# Patient Record
Sex: Female | Born: 1970
Health system: Southern US, Community
[De-identification: ages and names within clinical notes are randomized; demographics above are authoritative.]

## PROBLEM LIST (undated history)

## (undated) DIAGNOSIS — R000 Tachycardia, unspecified: Secondary | ICD-10-CM

## (undated) DIAGNOSIS — D649 Anemia, unspecified: Secondary | ICD-10-CM

---

## 1998-01-13 ENCOUNTER — Other Ambulatory Visit: Admission: RE | Admit: 1998-01-13 | Discharge: 1998-01-13 | Payer: Self-pay | Admitting: Gynecology

## 1999-05-31 ENCOUNTER — Other Ambulatory Visit: Admission: RE | Admit: 1999-05-31 | Discharge: 1999-05-31 | Payer: Self-pay | Admitting: Gynecology

## 2000-06-17 ENCOUNTER — Other Ambulatory Visit: Admission: RE | Admit: 2000-06-17 | Discharge: 2000-06-17 | Payer: Self-pay | Admitting: Gynecology

## 2002-03-09 ENCOUNTER — Other Ambulatory Visit: Admission: RE | Admit: 2002-03-09 | Discharge: 2002-03-09 | Payer: Self-pay | Admitting: Gynecology

## 2003-07-06 ENCOUNTER — Other Ambulatory Visit: Admission: RE | Admit: 2003-07-06 | Discharge: 2003-07-06 | Payer: Self-pay | Admitting: Gynecology

## 2004-03-19 ENCOUNTER — Inpatient Hospital Stay (HOSPITAL_COMMUNITY): Admission: AD | Admit: 2004-03-19 | Discharge: 2004-03-23 | Payer: Self-pay | Admitting: Obstetrics and Gynecology

## 2004-07-27 ENCOUNTER — Other Ambulatory Visit: Admission: RE | Admit: 2004-07-27 | Discharge: 2004-07-27 | Payer: Self-pay | Admitting: Gynecology

## 2005-07-23 ENCOUNTER — Other Ambulatory Visit: Admission: RE | Admit: 2005-07-23 | Discharge: 2005-07-23 | Payer: Self-pay | Admitting: Gynecology

## 2006-01-11 ENCOUNTER — Ambulatory Visit (HOSPITAL_COMMUNITY): Admission: RE | Admit: 2006-01-11 | Discharge: 2006-01-11 | Payer: Self-pay | Admitting: Obstetrics and Gynecology

## 2006-01-11 ENCOUNTER — Encounter (INDEPENDENT_AMBULATORY_CARE_PROVIDER_SITE_OTHER): Payer: Self-pay | Admitting: Specialist

## 2006-05-16 ENCOUNTER — Inpatient Hospital Stay (HOSPITAL_COMMUNITY): Admission: AD | Admit: 2006-05-16 | Discharge: 2006-05-16 | Payer: Self-pay | Admitting: Obstetrics and Gynecology

## 2006-11-29 ENCOUNTER — Inpatient Hospital Stay (HOSPITAL_COMMUNITY): Admission: RE | Admit: 2006-11-29 | Discharge: 2006-12-02 | Payer: Self-pay | Admitting: Obstetrics and Gynecology

## 2006-11-29 ENCOUNTER — Encounter (INDEPENDENT_AMBULATORY_CARE_PROVIDER_SITE_OTHER): Payer: Self-pay | Admitting: Obstetrics and Gynecology

## 2010-10-16 ENCOUNTER — Emergency Department (HOSPITAL_COMMUNITY): Payer: BC Managed Care – PPO

## 2010-10-16 ENCOUNTER — Emergency Department (HOSPITAL_COMMUNITY)
Admission: EM | Admit: 2010-10-16 | Discharge: 2010-10-17 | Disposition: A | Discharge status: Home / Self Care | Payer: BC Managed Care – PPO | Attending: Emergency Medicine | Admitting: Emergency Medicine

## 2010-10-16 DIAGNOSIS — R209 Unspecified disturbances of skin sensation: Secondary | ICD-10-CM | POA: Insufficient documentation

## 2010-10-16 DIAGNOSIS — H538 Other visual disturbances: Secondary | ICD-10-CM | POA: Insufficient documentation

## 2010-10-16 DIAGNOSIS — F410 Panic disorder [episodic paroxysmal anxiety] without agoraphobia: Secondary | ICD-10-CM | POA: Insufficient documentation

## 2010-10-16 DIAGNOSIS — R0989 Other specified symptoms and signs involving the circulatory and respiratory systems: Secondary | ICD-10-CM | POA: Insufficient documentation

## 2010-10-16 DIAGNOSIS — R0609 Other forms of dyspnea: Secondary | ICD-10-CM | POA: Insufficient documentation

## 2010-10-16 LAB — POCT I-STAT, CHEM 8
BUN: <3 mg/dL — ABNORMAL LOW (ref 6–23)
Calcium, Ion: 1.05 mmol/L — ABNORMAL LOW (ref 1.12–1.32)
Chloride: 108 mEq/L (ref 96–112)
Creatinine, Ser: 0.7 mg/dL (ref 0.4–1.2)
Glucose, Bld: 123 mg/dL — ABNORMAL HIGH (ref 70–99)
TCO2: 17 mmol/L (ref 0–100)

## 2010-10-16 LAB — CBC
HCT: 35.3 % — ABNORMAL LOW (ref 36.0–46.0)
MCH: 30.3 pg (ref 26.0–34.0)
MCHC: 34.6 g/dL (ref 30.0–36.0)
MCV: 87.8 fL (ref 78.0–100.0)
Platelets: 301 10*3/uL (ref 150–400)
RDW: 12.9 % (ref 11.5–15.5)
WBC: 11.5 10*3/uL — ABNORMAL HIGH (ref 4.0–10.5)

## 2010-10-16 LAB — POCT CARDIAC MARKERS: CKMB, poc: <1 ng/mL — ABNORMAL LOW (ref 1.0–8.0)

## 2010-10-16 LAB — GLUCOSE, CAPILLARY

## 2010-10-16 LAB — PRO B NATRIURETIC PEPTIDE: Pro B Natriuretic peptide (BNP): 41.1 pg/mL (ref 0–125)

## 2010-10-17 NOTE — Op Note (Signed)
Tammy Roth, Tammy Roth NO.:  [id number]   MEDICAL RECORD NO.:  [id number]          PATIENT TYPE:  INP   LOCATION:  9199                          FACILITY:  [facility]   PHYSICIAN:  Carrington Clamp, M.D. DATE OF BIRTH:  [birth year]   DATE OF PROCEDURE:  11/29/2006  DATE OF DISCHARGE:                               OPERATIVE REPORT   PREOPERATIVE DIAGNOSIS:  Repeat cesarean section at term and multiparous  - desires permanent sterility.   POSTOPERATIVE DIAGNOSIS:  Repeat cesarean section at term and  multiparous - desires permanent sterility.   PROCEDURE:  Repeat low transverse Cesarean section at term and bilateral  tubal ligation.   SURGEON:  Carrington Clamp, M.D.   ASSISTANT:  Ilda Mori, M.D.   ANESTHESIA:  Spinal.   SPECIMENS:  Right and left tubal segments.   ESTIMATED BLOOD LOSS:  400 mL.   INTRAVENOUS FLUIDS:  2500 mL.   URINE OUTPUT:  200 mL.   COMPLICATIONS:  None.   FINDINGS:  An 8-pound 4-ounce baby girl, Apgars 8 and 9. There were  normal tubes, ovaries and uterus otherwise seen.   MEDICATIONS:  Pitocin and Ancef.   COUNTS:  Were correct x3.   TECHNIQUE:  After spinal anesthesia was achieved, the patient was  prepped and draped in the usual sterile fashion in the dorsal supine  position with the leftward tilt. Pfannenstiel skin incision was made  with a scalpel and carried down to the fascia with Bovie cautery. Fascia  was incised in the midline with the scalpel and then carried in a  transverse curvilinear manner with the Mayo scissors. Fascia was  reflected superiorly and inferiorly from the rectus muscles and the  rectus muscles split in the midline with both sharp and blunt  dissection. A bowel-free portion of peritoneum was entered into bluntly  and the peritoneum stretched up after the rectus muscles were split with  the Mayo scissors. The bladder blade was placed, and the bladder flap  created with incision at the vesicouterine  fascia in the upper portion  of the lower uterine segment in a transverse curvilinear manner. Bladder  flap was created with sharp and blunt dissection and bladder place  replaced.   A 2-cm incision was made in the upper portion of the lower uterine  segment in transverse curvilinear manner until the amnion could be seen.  The incision was stretched open with blunt dissection, and the amnion  ruptured with Allis. Clear fluid was noted. The baby was identified in  the vertex presentation, delivered without complication. The baby was  bulb suctioned, and the cord was clamped and cut. Cord blood was  obtained.  Baby was handed to waiting pediatrics.   The placenta was then delivered manually and the uterus exteriorized,  wrapped in wet lap, cleared of all debris. The uterine incision was  closed with a running locked stitch of 0 Monocryl. An imbricating layer  of 0 Monocryl was performed as well. Attention was turned to the  fallopian tubes, which each were tented up with a Babcock. A vascular  portion of the mesosalpinx was then gently treated with  the Bovie  cautery and 2 free-hand ties of plain gut were passed and tied on each  side to create an intervening segment of approximately 3 cm. An  additional tie was performed on each side, and the segment was excised  with the Metzenbaum scissors. The same procedure was performed on the  left hand side.   The uterus was then approximated in the abdomen, and the uterine  incision and tubal sites were all inspected and found to be hemostatic.  Irrigation was performed, and the abdominal cavity cleared of debris.  The peritoneum was then closed with running stitch of 2-0 Vicryl. This  incorporated the rectus muscles as a separate layer. The fascia was  closed with a running stitch of 0 Vicryl. The subcutaneous tissue was  rendered hemostatic with Bovie cautery irrigation. This was then closed  with interrupted stitches of 2-0 plain gut. The  skin was closed with  staples. The patient was returned to the recovery room in stable  condition.      Carrington Clamp, M.D.  Electronically Signed     MH/MEDQ  D:  11/29/2006  T:  11/29/2006  Job:  [id number]

## 2010-10-20 NOTE — Op Note (Signed)
NAMEWILLETTE, Tammy Roth NO.:  [id number]   MEDICAL RECORD NO.:  [id number]          PATIENT TYPE:  INP   LOCATION:  9138                          [facility]ACILITY:  [facility]   PHYSICIAN:  Carrington Clamp, M.D. DATE OF BIRTH:  20-Jun-1970   DATE OF PROCEDURE:  03/20/2004  DATE OF DISCHARGE:                                 OPERATIVE REPORT   PREOPERATIVE DIAGNOSES:  1.  Term pregnancy.  2.  Arrest of descent.  3.  Failed forceps trial x1.   POSTOPERATIVE DIAGNOSES:  1.  Term pregnancy.  2.  Arrest of descent.  3.  Failed forceps trial x1.   PROCEDURE:  Primary low transverse cesarean section.   ATTENDING PHYSICIAN:  Carrington Clamp, M.D.   ANESTHESIA:  Epidural.   ESTIMATED BLOOD LOSS:  1000 mL.   IV FLUIDS:  2500 mL.   URINE OUTPUT:  250 mL.   COMPLICATIONS:  Needle stick injury of the scrub tech. The patient was  notified. There was no cross contamination of the patient. Needle stick  injury page book is being filled out.   FINDINGS:  Female infant in the vertex presentation direct OP.  Apgar's were 8  & 9, weight 9 pounds. There were normal tubes, uterus and ovaries seen. The  vagina was inspected both manually and visually at the end of the procedure  to ensure that there were no lacerations from the forceps attempt and there  were none.   MEDICATIONS:  Ancef and Pitocin.   PATHOLOGY:  None.   COUNTS:  Correct x3.   INDICATIONS FOR PROCEDURE:  The patient became complete, complete and zero  and pushed 2 hours and 20 minutes to plus 1.  The epidural was not giving  complete relief and so the patient was redosed and placed into Sims to try  and turn the baby from OP.  The patient rested for about 30 minutes and was  rechecked and found to be OP and plus 2.  A pudendal block was then  administered with about 10 mL of plain lidocaine with relatively good relief  especially of the pelvis. Luikart forceps were successfully placed without  complications and  minimal discomfort to the patient. However, during one  contraction, adequate traction was placed on the forceps but the baby did  not descend. Forceps were then removed and the patient was counseled for a  primary cesarean section for arrest of descent. At that point, manual  investigation of the vagina revealed no lacerations.  The patient had her  epidural replaced before the surgery.   TECHNIQUE:  After the second epidural had been placed and adequate relief  achieved, the patient was prepped and draped in the usual sterile fashion in  the dorsal supine position with a leftward tilt. The Foley catheter was  emptying the bladder. A Pfannenstiel skin incision was made with the scalpel  and carried down to the fascia with the Bovie cautery. The fascia was  incised with the midline with the scalpel and carried in a transverse  curvilinear manner with the Mayo scissors. The fascia was then reflected  superiorly and inferiorly  from the rectus muscles and the bowel free portion  of the peritoneum was entered into bluntly. Good visualization of the bowel  and bladder was noted as the peritoneum was then incised in a superior and  inferior manner.   The bladder blade was replaced and the vesicouterine fascia was tented up.  This was then incised in a transverse curvilinear manner and the bladder  flap created bluntly. The bladder blade was replaced and a 2 cm incision was  made in the upper portion of the lower uterine segment. Clear fluid was  noted upon entry to the amnion and the bandage scissors were used to extend  the incision in the transverse curvilinear manner. The baby was identified  in the direct OP presentation and delivered without complication. The baby  was bulb suctioned and cord was clamped and cut. The baby was handed to  waiting pediatrics and cord bloods were obtained.   The placenta was then cleared manually and the uterus exteriorized, wrapped  in a wet lap, and  cleared of all debris.  Bladder blade was replaced and  ring forceps were used to identify the angles of the incision. There was an  extension into the cervix and the angle of this was able to be brought up  with a ring forceps. Stitch of #0 Vicryl secured the extension in a running  locked fashion. A running locked stitch was then used to close the rest of  the uterine incision with #0 Monocryl. An imbricating layer was used to  close this as well and several figure-of-eight stitches were needed to help  ensure hemostasis after the imbricating layer.   The uterus was then reapproximated in the abdomen and the gutters cleared of  all debris with irrigation. The uterine incisions reinspected and an  additional figure-of-eight suture was placed at this time to ensure  hemostasis. Hemostasis was achieved and all instruments were then withdrawn  from the abdomen. It was during the uterine closure that the needle stick  injury of the scrub tech had happened.   The peritoneum was then closed with a running stitch of 2-0 Vicryl. The  __________ also came back and included a second layer of the muscles. The  fascia was then closed with a running stitch of #0 Vicryl. The subcutaneous  tissue was rendered hemostatic with the Bovie cautery and irrigation and  then was closed with interrupted stitches of 2-0 plain gut. The skin was  closed with staples.   At this point, attention was turned to the vagina where a ring forceps,  sponge stick and a right angled retractor were used to visually inspect the  vaginal vault. Manual inspection was also performed and there were no  lacerations noted. The patient tolerated the procedure well and was returned  to the recovery room in stable condition.      MH/MEDQ  D:  03/20/2004  T:  03/21/2004  Job:  [id number]

## 2010-10-20 NOTE — Discharge Summary (Signed)
NAMENOHEMY, KOOP            ACCOUNT NO.:  [id number]   MEDICAL RECORD NO.:  [id number]          PATIENT TYPE:  INP   LOCATION:  9114                          [facility]ACILITY:  [facility]   PHYSICIAN:  Kendra H. Tammy Craw, MD     DATE OF BIRTH:  1970-12-12   DATE OF ADMISSION:  11/29/2006  DATE OF DISCHARGE:  12/02/2006                               DISCHARGE SUMMARY   FINAL DIAGNOSIS:  Intrauterine pregnancy at term.  History of prior  cesarean section.  The patient desires repeat cesarean section,  multiparous, desires permanent sterilization.   PROCEDURE:  Repeat low transverse cesarean section and bilateral tubal  ligation.  Surgeon Dr. Carrington Clamp with Dr. Ilda Mori.   COMPLICATIONS:  None.   HISTORY OF PRESENT ILLNESS:  This 40 year old G3, P1-0-1-1 presents at  term for repeat cesarean section.  The patient had had a prior cesarean  section with her first pregnancy and desires repeat with this pregnancy  as well.  Her antepartum course has been uncomplicated.  She expressed  her desires for postpartum tubal ligation throughout her hospital  course.  The patient was taken to the operating room on November 29, 2006,  by Dr. Carrington Clamp where a repeat low transverse cesarean section  was performed with the delivery of an 8 pounds, 4 ounces female infant  with Apgars of 8 and 9.  Delivery went without complications.  The  patient still expressed her desires for permanent sterilization.  This  was performed without complications.  The patient's postoperative course  was benign without any significant fevers.  She was felt ready for  discharge on postoperative day #3.  She was sent home on a regular diet,  told to decrease activities, told to continue her prenatal vitamins, was  given Percocet 1-2 every 4-6 hours as needed for the pain.  Was to  follow up in our office in 2 weeks for an incision check.  Instructions  and precautions were reviewed with the patient.   LABORATORIES  ON DISCHARGE:  The patient had a hemoglobin of 10.9, white  blood cell count of 7.4 and platelets of 245,000.      Tammy Roth, P.A.-C.      Tammy Roth. Tammy Craw, MD  Electronically Signed    MB/MEDQ  D:  01/07/2007  T:  01/07/2007  Job:  [id number]

## 2010-10-20 NOTE — Discharge Summary (Signed)
Tammy Roth, Tammy Roth NO.:  [id number]   MEDICAL RECORD NO.:  [id number]          PATIENT TYPE:  INP   LOCATION:  9138                          [facility]   PHYSICIAN:  Carrington Clamp, M.D. DATE OF BIRTH:  1971-02-03   DATE OF ADMISSION:  03/19/2004  DATE OF DISCHARGE:  03/23/2004                                 DISCHARGE SUMMARY   FINAL DIAGNOSES:  Intrauterine pregnancy at term, arrest of descent, and  failed forceps trial x1.   PROCEDURE:  Primary low transverse cesarean section.   SURGEON:  Carrington Clamp, M.D.   COMPLICATIONS:  None.   HISTORY OF PRESENT ILLNESS:  This 40 year old G1, P0 presents to [facility] on March 19, 2004 with mild regular contractions.  The patient's blood pressure was noted to be slightly elevated at about  144/99 and patient was admitted for management of pregnancy induced  hypertension at this time. The patient's cervix upon admission was 1 cm  dilated, 40% effaced, at a minus 2 station. She did have about 2+ lower  extremity edema. The patient was admitted. Blood work was run and induction  of labor was begun since the patient was at term. The patient's pregnancy  induced hypertension labs were all within normal limits. The patient dilated  to complete. She pushed for about 2 hours. After pushing for a while, her  epidural was not giving her complete pain relief. She was re-dosed and when  she was rechecked the baby was found to be OP at plus 2 station. At this  point, Luikart forceps were placed without complication, pulled with one  contraction but without the baby coming down at all. Forceps were removed at  this point and the patient was counseled about the arrest of descent. The  patient was taken to the operating room on the 17th by Dr. Carrington Clamp,  where a primary low transverse cesarean section was performed with the  delivery of a 9 pound 0 ounce female infant with Apgar's of 8 and  9. Delivery  went without complications. The patient's postoperative course was benign  without significant fevers. She just had some postoperative anemia. She was  felt ready for discharge on postoperative day 3. Was sent home on a regular  diet. Told to decrease activities. Told to continue her prenatal vitamins  and her iron supplement three times daily. She was given Percocet 1 to 2  every 4 hours as needed for pain. Told that she could use over-the-counter  Motrin if needed. She received her Rubella vaccine before discharge because  she had an equivocal titer that was drawn at the beginning of her pregnancy.  She was to followup in the office in 4 weeks.   LABORATORY DATA:  On discharge, the patient had a hemoglobin of 8.5, white  blood cell count of 10.8 and PIH labs all within normal limits.      MB/MEDQ  D:  04/17/2004  T:  04/17/2004  Job:  [id number]   cc:   Carrington Clamp, M.D.  [address]  [address]  [place], [place] [address]  Fax: [phone]

## 2010-10-20 NOTE — Op Note (Signed)
Tammy Roth, Tammy NO.:  [id number]   MEDICAL RECORD NO.:  [id number]          PATIENT TYPE:  AMB   LOCATION:  SDC                           [facility]ILITY:  [facility]   PHYSICIAN:  Carrington Clamp, M.D. DATE OF BIRTH:  10-17-1970   DATE OF PROCEDURE:  01/11/2006  DATE OF DISCHARGE:  01/11/2006                                 OPERATIVE REPORT   PREOPERATIVE DIAGNOSIS:  Missed abortion.   POSTOPERATIVE DIAGNOSIS:  Missed abortion.   PROCEDURE:  Dilation and evacuation.   SURGEON:  Carrington Clamp, M.D.   ASSISTANT:  None.   ANESTHESIA:  General.   SPECIMEN:  Uterine contents.   ESTIMATED BLOOD LOSS:  Minimal.   INTRAVENOUS FLUIDS:  800 mL.   URINE OUTPUT:  Not measured.   COMPLICATIONS:  None.   FINDINGS:  A 7 weeks size uterus down to 6 weeks size with good crie post  procedure.   MEDICATIONS:  Methergine.   COUNTS:  Correct x3.   TECHNIQUE:  After adequate general anesthesia was achieved, the patient was  prepped and draped in usual sterile fashion in dorsal lithotomy position.  The bladder was emptied with a red rubber catheter and then speculum placed  in the vagina.  The cervix was dilated up with Shawnie Pons dilators and a 9-mm  curette passed into the uterus.  Alternating  suction and sharp curettage was performed until good crie was obtained.  All  instruments were then withdrawn from the cervix and the vagina.  The patient  tolerated the procedure well and was returned to the recovery room in stable  condition.      Carrington Clamp, M.D.  Electronically Signed     MH/MEDQ  D:  03/07/2006  T:  03/08/2006  Job:  [id number]

## 2011-03-21 LAB — CBC
HCT: 30.1 — ABNORMAL LOW
HCT: 35.4 — ABNORMAL LOW
Hemoglobin: 10.3 — ABNORMAL LOW
Hemoglobin: 10.9 — ABNORMAL LOW
MCHC: 34.1
MCHC: 34.3
MCV: 92.5
MCV: 93.5
Platelets: 255
RBC: 3.22 — ABNORMAL LOW
RBC: 3.42 — ABNORMAL LOW
RBC: 3.83 — ABNORMAL LOW
RDW: 13.7
WBC: 8.5

## 2011-03-21 LAB — RPR: RPR Ser Ql: NONREACTIVE

## 2011-05-23 ENCOUNTER — Ambulatory Visit: Payer: Self-pay

## 2012-09-10 ENCOUNTER — Other Ambulatory Visit: Payer: Self-pay | Admitting: Obstetrics and Gynecology

## 2013-09-15 ENCOUNTER — Other Ambulatory Visit: Payer: Self-pay | Admitting: Obstetrics and Gynecology

## 2013-09-17 ENCOUNTER — Other Ambulatory Visit: Payer: Self-pay | Admitting: Obstetrics and Gynecology

## 2013-09-17 DIAGNOSIS — R928 Other abnormal and inconclusive findings on diagnostic imaging of breast: Secondary | ICD-10-CM

## 2013-09-25 ENCOUNTER — Encounter (INDEPENDENT_AMBULATORY_CARE_PROVIDER_SITE_OTHER): Payer: Self-pay

## 2013-09-25 ENCOUNTER — Ambulatory Visit
Admission: RE | Admit: 2013-09-25 | Discharge: 2013-09-25 | Disposition: A | Discharge status: Home / Self Care | Payer: BC Managed Care – PPO | Source: Ambulatory Visit | Attending: Obstetrics and Gynecology | Admitting: Obstetrics and Gynecology

## 2013-09-25 DIAGNOSIS — R928 Other abnormal and inconclusive findings on diagnostic imaging of breast: Secondary | ICD-10-CM

## 2014-01-27 ENCOUNTER — Emergency Department (HOSPITAL_COMMUNITY): Payer: BC Managed Care – PPO

## 2014-01-27 ENCOUNTER — Encounter (HOSPITAL_COMMUNITY): Payer: Self-pay | Admitting: Emergency Medicine

## 2014-01-27 ENCOUNTER — Emergency Department (HOSPITAL_COMMUNITY)
Admission: EM | Admit: 2014-01-27 | Discharge: 2014-01-27 | Disposition: A | Discharge status: Home / Self Care | Payer: BC Managed Care – PPO | Attending: Emergency Medicine | Admitting: Emergency Medicine

## 2014-01-27 DIAGNOSIS — R51 Headache: Secondary | ICD-10-CM | POA: Insufficient documentation

## 2014-01-27 DIAGNOSIS — R000 Tachycardia, unspecified: Secondary | ICD-10-CM | POA: Diagnosis not present

## 2014-01-27 DIAGNOSIS — R002 Palpitations: Secondary | ICD-10-CM | POA: Diagnosis present

## 2014-01-27 DIAGNOSIS — Z862 Personal history of diseases of the blood and blood-forming organs and certain disorders involving the immune mechanism: Secondary | ICD-10-CM | POA: Diagnosis not present

## 2014-01-27 DIAGNOSIS — R110 Nausea: Secondary | ICD-10-CM | POA: Diagnosis not present

## 2014-01-27 DIAGNOSIS — Z3202 Encounter for pregnancy test, result negative: Secondary | ICD-10-CM | POA: Diagnosis not present

## 2014-01-27 DIAGNOSIS — R197 Diarrhea, unspecified: Secondary | ICD-10-CM | POA: Insufficient documentation

## 2014-01-27 DIAGNOSIS — R0602 Shortness of breath: Secondary | ICD-10-CM | POA: Diagnosis not present

## 2014-01-27 HISTORY — DX: Tachycardia, unspecified: R00.0

## 2014-01-27 HISTORY — DX: Anemia, unspecified: D64.9

## 2014-01-27 LAB — I-STAT TROPONIN, ED: Troponin i, poc: 0 ng/mL (ref 0.00–0.08)

## 2014-01-27 LAB — BASIC METABOLIC PANEL
Anion gap: 16 — ABNORMAL HIGH (ref 5–15)
BUN: 6 mg/dL (ref 6–23)
CHLORIDE: 104 meq/L (ref 96–112)
CO2: 20 meq/L (ref 19–32)
Calcium: 9.2 mg/dL (ref 8.4–10.5)
Creatinine, Ser: 0.7 mg/dL (ref 0.50–1.10)
GFR calc Af Amer: >90 mL/min (ref >90)
GFR calc non Af Amer: >90 mL/min (ref >90)
GLUCOSE: 119 mg/dL — AB (ref 70–99)
POTASSIUM: 4.4 meq/L (ref 3.7–5.3)
Sodium: 140 mEq/L (ref 137–147)

## 2014-01-27 LAB — D-DIMER, QUANTITATIVE: D-Dimer, Quant: <0.27 ug/mL-FEU (ref 0.00–0.48)

## 2014-01-27 LAB — URINALYSIS, ROUTINE W REFLEX MICROSCOPIC
BILIRUBIN URINE: NEGATIVE
Glucose, UA: NEGATIVE mg/dL
Hgb urine dipstick: NEGATIVE
KETONES UR: 15 mg/dL — AB
Leukocytes, UA: NEGATIVE
NITRITE: NEGATIVE
PH: 6 (ref 5.0–8.0)
Protein, ur: NEGATIVE mg/dL
SPECIFIC GRAVITY, URINE: 1.019 (ref 1.005–1.030)
UROBILINOGEN UA: 0.2 mg/dL (ref 0.0–1.0)

## 2014-01-27 LAB — TSH: TSH: 1.9 u[IU]/mL (ref 0.350–4.500)

## 2014-01-27 LAB — CBC
HEMATOCRIT: 39.2 % (ref 36.0–46.0)
HEMOGLOBIN: 13.2 g/dL (ref 12.0–15.0)
MCH: 30.4 pg (ref 26.0–34.0)
MCHC: 33.7 g/dL (ref 30.0–36.0)
MCV: 90.3 fL (ref 78.0–100.0)
Platelets: 322 10*3/uL (ref 150–400)
RBC: 4.34 MIL/uL (ref 3.87–5.11)
RDW: 13.5 % (ref 11.5–15.5)
WBC: 6 10*3/uL (ref 4.0–10.5)

## 2014-01-27 LAB — T4, FREE: FREE T4: 1.05 ng/dL (ref 0.80–1.80)

## 2014-01-27 LAB — POC URINE PREG, ED: Preg Test, Ur: NEGATIVE

## 2014-01-27 MED ORDER — SODIUM CHLORIDE 0.9 % IV BOLUS (SEPSIS)
1000.0000 mL | Freq: Once | INTRAVENOUS | Status: AC
  Administered 2014-01-27: 1000 mL via INTRAVENOUS

## 2014-01-27 MED ORDER — ONDANSETRON HCL 4 MG/2ML IJ SOLN
4.0000 mg | Freq: Once | INTRAMUSCULAR | Status: AC
  Administered 2014-01-27: 4 mg via INTRAVENOUS
  Filled 2014-01-27: qty 2

## 2014-01-27 NOTE — ED Notes (Signed)
Per pt  sts heart palpitations over the past few days. sts also some nausea and dizziness.

## 2014-01-27 NOTE — Discharge Instructions (Signed)
°Cardiac Event Monitoring °A cardiac event monitor is a small recording device used to help detect abnormal heart rhythms (arrhythmias). The monitor is used to record heart rhythm when noticeable symptoms such as the following occur: °· Fast heartbeats (palpitations), such as heart racing or fluttering. °· Dizziness. °· Fainting or light-headedness. °· Unexplained weakness. °The monitor is wired to two electrodes placed on your chest. Electrodes are flat, sticky disks that attach to your skin. The monitor can be worn for up to 30 days. You will wear the monitor at all times, except when bathing.  °HOW TO USE YOUR CARDIAC EVENT MONITOR °A technician will prepare your chest for the electrode placement. The technician will show you how to place the electrodes, how to work the monitor, and how to replace the batteries. Take time to practice using the monitor before you leave the office. Make sure you understand how to send the information from the monitor to your health care provider. This requires a telephone with a landline, not a cell phone. You need to: °· Wear your monitor at all times, except when you are in water: °¨ Do not get the monitor wet. °¨ Take the monitor off when bathing. Do not swim or use a hot tub with it on. °· Keep your skin clean. Do not put body lotion or moisturizer on your chest. °· Change the electrodes daily or any time they stop sticking to your skin. You might need to use tape to keep them on. °· It is possible that your skin under the electrodes could become irritated. To keep this from happening, try to put the electrodes in slightly different places on your chest. However, they must remain in the area under your left breast and in the upper right section of your chest. °· Make sure the monitor is safely clipped to your clothing or in a location close to your body that your health care provider recommends. °· Press the button to record when you feel symptoms of heart trouble, such as  dizziness, weakness, light-headedness, palpitations, thumping, shortness of breath, unexplained weakness, or a fluttering or racing heart. The monitor is always on and records what happened slightly before you pressed the button, so do not worry about being too late to get good information. °· Keep a diary of your activities, such as walking, doing chores, and taking medicine. It is especially important to note what you were doing when you pushed the button to record your symptoms. This will help your health care provider determine what might be contributing to your symptoms. The information stored in your monitor will be reviewed by your health care provider alongside your diary entries. °· Send the recorded information as recommended by your health care provider. It is important to understand that it will take some time for your health care provider to process the results. °· Change the batteries as recommended by your health care provider. °SEEK IMMEDIATE MEDICAL CARE IF:  °· You have chest pain. °· You have extreme difficulty breathing or shortness of breath. °· You develop a very fast heartbeat that persists. °· You develop dizziness that does not go away. °· You faint or constantly feel you are about to faint. °Document Released: 02/28/2008 Document Revised: 10/05/2013 Document Reviewed: 11/17/2012 °ExitCare® Patient Information ©2015 [employer]. This information is not intended to replace advice given to you by your health care provider. Make sure you discuss any questions you have with your health care provider. °Palpitations °A palpitation is the feeling   that your heartbeat is irregular or is faster than normal. It may feel like your heart is fluttering or skipping a beat. Palpitations are usually not a serious problem. However, in some cases, you may need further medical evaluation. °CAUSES  °Palpitations can be caused by: °· Smoking. °· Caffeine or other stimulants, such as diet pills or energy  drinks. °· Alcohol. °· Stress and anxiety. °· Strenuous physical activity. °· Fatigue. °· Certain medicines. °· Heart disease, especially if you have a history of irregular heart rhythms (arrhythmias), such as atrial fibrillation, atrial flutter, or supraventricular tachycardia. °· An improperly working pacemaker or defibrillator. °DIAGNOSIS  °To find the cause of your palpitations, your health care provider will take your medical history and perform a physical exam. Your health care provider may also have you take a test called an ambulatory electrocardiogram (ECG). An ECG records your heartbeat patterns over a 24-hour period. You may also have other tests, such as: °· Transthoracic echocardiogram (TTE). During echocardiography, sound waves are used to evaluate how blood flows through your heart. °· Transesophageal echocardiogram (TEE). °· Cardiac monitoring. This allows your health care provider to monitor your heart rate and rhythm in real time. °· Holter monitor. This is a portable device that records your heartbeat and can help diagnose heart arrhythmias. It allows your health care provider to track your heart activity for several days, if needed. °· Stress tests by exercise or by giving medicine that makes the heart beat faster. °TREATMENT  °Treatment of palpitations depends on the cause of your symptoms and can vary greatly. Most cases of palpitations do not require any treatment other than time, relaxation, and monitoring your symptoms. Other causes, such as atrial fibrillation, atrial flutter, or supraventricular tachycardia, usually require further treatment. °HOME CARE INSTRUCTIONS  °· Avoid: °· Caffeinated coffee, tea, soft drinks, diet pills, and energy drinks. °· Chocolate. °· Alcohol. °· Stop smoking if you smoke. °· Reduce your stress and anxiety. Things that can help you relax include: °· A method of controlling things in your body, such as your heartbeats, with your mind  (biofeedback). °· Yoga. °· Meditation. °· Physical activity such as swimming, jogging, or walking. °· Get plenty of rest and sleep. °SEEK MEDICAL CARE IF:  °· You continue to have a fast or irregular heartbeat beyond 24 hours. °· Your palpitations occur more often. °SEEK IMMEDIATE MEDICAL CARE IF: °· You have chest pain or shortness of breath. °· You have a severe headache. °· You feel dizzy or you faint. °MAKE SURE YOU: °· Understand these instructions. °· Will watch your condition. °· Will get help right away if you are not doing well or get worse. °Document Released: 05/18/2000 Document Revised: 05/26/2013 Document Reviewed: 07/20/2011 °ExitCare® Patient Information ©2015 [employer]. This information is not intended to replace advice given to you by your health care provider. Make sure you discuss any questions you have with your health care provider. ° °

## 2014-01-27 NOTE — ED Notes (Signed)
Hx of heart racing x 3 years ago and she was placed  On a med for it

## 2014-01-27 NOTE — ED Provider Notes (Signed)
CSN: [id number]     Arrival date & time 01/27/14  4782 History   First MD Initiated Contact with Patient 01/27/14 [phone]     Chief Complaint  Patient presents with  . Palpitations     (Consider location/radiation/quality/duration/timing/severity/associated sxs/prior Treatment) HPI 43 year old female presents with palpitations over the last 3 days. She states they come and go multiple times day. They last about 20 seconds. She feels short of breath after they resolved. She states they're not causing her pain but she feels like her heart is beating out of her chest. She thinks she's been a little more short of breath than normal with exertion over the past week. She's been persistently nauseous over the past week. She's had a couple loose stools per day over last 4-5 days. Denies any fevers. Has had a generalized headache is similar to prior headaches during this time. No urinary symptoms. No neck swelling or neck pain. She has a history of tachycardia that she describes as different than this and used to be on medicine from her PCP, Dr. Hyacinth Meeker. She called her PCP this morning and advised to go to the ED.  Past Medical History  Diagnosis Date  . Tachycardia   . Anemia    History reviewed. No pertinent past surgical history. History reviewed. No pertinent family history. History  Substance Use Topics  . Smoking status: Never Smoker   . Smokeless tobacco: Not on file  . Alcohol Use: No   OB History   Grav Para Term Preterm Abortions TAB SAB Ect Mult Living                 Review of Systems  Constitutional: Negative for fever.  Respiratory: Positive for shortness of breath. Negative for cough.   Cardiovascular: Positive for palpitations. Negative for chest pain and leg swelling.  Gastrointestinal: Positive for nausea and diarrhea. Negative for vomiting and abdominal pain.  Genitourinary: Negative for dysuria.  Musculoskeletal: Negative for back pain.  Neurological: Positive for  headaches.  All other systems reviewed and are negative.     Allergies  Review of patient's allergies indicates no known allergies.  Home Medications   Prior to Admission medications   Not on File   BP 138/87  Pulse 107  Temp(Src) 98.1 F (36.7 C) (Oral)  Resp 8  SpO2 100%  LMP 01/18/2014 Physical Exam  Nursing note and vitals reviewed. Constitutional: She is oriented to person, place, and time. She appears well-developed and well-nourished. No distress.  HENT:  Head: Normocephalic and atraumatic.  Right Ear: External ear normal.  Left Ear: External ear normal.  Nose: Nose normal.  Eyes: Right eye exhibits no discharge. Left eye exhibits no discharge.  Neck: No thyromegaly present.  Cardiovascular: Regular rhythm and normal heart sounds.  Tachycardia present.   Pulmonary/Chest: Effort normal and breath sounds normal. She has no wheezes. She has no rales.  Abdominal: Soft. She exhibits no distension. There is no tenderness.  Neurological: She is alert and oriented to person, place, and time.  Skin: Skin is warm and dry.    ED Course  Procedures (including critical care time) Labs Review Labs Reviewed  BASIC METABOLIC PANEL - Abnormal; Notable for the following:    Glucose, Bld 119 (*)    Anion gap 16 (*)    All other components within normal limits  URINALYSIS, ROUTINE W REFLEX MICROSCOPIC - Abnormal; Notable for the following:    Color, Urine AMBER (*)    APPearance CLOUDY (*)  Ketones, ur 15 (*)    All other components within normal limits  CBC  D-DIMER, QUANTITATIVE  TSH  T4, FREE  I-STAT TROPOININ, ED  POC URINE PREG, ED    Imaging Review Dg Chest 2 View  01/27/2014   CLINICAL DATA:  43 year old female with palpitations. Initial encounter.  EXAM: CHEST  2 VIEW  COMPARISON:  10/16/2010.  FINDINGS: Improved lung volumes. Normal cardiac size and mediastinal contours. Visualized tracheal air column is within normal limits. The lungs are clear aside from  mildly increased interstitial markings diffusely. No acute osseous abnormality identified.  IMPRESSION: No acute cardiopulmonary abnormality.   Electronically Signed   By: Augusto Gamble M.D.   On: 01/27/2014 10:16     EKG Interpretation   Date/Time:  Wednesday January 27 2014 09:21:46 EDT Ventricular Rate:  129 PR Interval:  150 QRS Duration: 76 QT Interval:  310 QTC Calculation: 454 R Axis:   69 Text Interpretation:  Sinus tachycardia Nonspecific ST abnormality  Abnormal ECG nonspecific changes similar to 2012 Confirmed by Anneta Rounds   MD, Moraima Burd ([id number]) on 01/27/2014 9:32:33 AM      MDM   Final diagnoses:  Palpitations    Patient with palpitations of uncertain etiology. Will arrhythmia she has here is sinus tachycardia. This resolved with IV fluids and Zofran. Given her small ketones in her urine is likely a dehydration component to her tachycardia. She states she has not had any of her palpitations while in the ED. I have low suspicion this is ACS related. Given her intermittent shortness of breath a d-dimer was evaluated to rule out low-risk PE and is negative. At this time she is stable for discharge, and I'll recommend a Holter monitor with her PCP.    Audree Camel, MD 01/27/14 1229

## 2015-04-12 ENCOUNTER — Other Ambulatory Visit: Payer: Self-pay | Admitting: Obstetrics and Gynecology

## 2015-10-25 DIAGNOSIS — I10 Essential (primary) hypertension: Secondary | ICD-10-CM | POA: Diagnosis not present

## 2015-10-25 DIAGNOSIS — R946 Abnormal results of thyroid function studies: Secondary | ICD-10-CM | POA: Diagnosis not present

## 2015-10-25 DIAGNOSIS — R5383 Other fatigue: Secondary | ICD-10-CM | POA: Diagnosis not present

## 2015-10-25 DIAGNOSIS — R000 Tachycardia, unspecified: Secondary | ICD-10-CM | POA: Diagnosis not present

## 2015-10-27 DIAGNOSIS — E669 Obesity, unspecified: Secondary | ICD-10-CM | POA: Diagnosis not present

## 2015-10-27 DIAGNOSIS — R000 Tachycardia, unspecified: Secondary | ICD-10-CM | POA: Diagnosis not present

## 2015-11-23 DIAGNOSIS — Z1231 Encounter for screening mammogram for malignant neoplasm of breast: Secondary | ICD-10-CM | POA: Diagnosis not present

## 2015-11-23 DIAGNOSIS — Z01419 Encounter for gynecological examination (general) (routine) without abnormal findings: Secondary | ICD-10-CM | POA: Diagnosis not present

## 2015-11-23 DIAGNOSIS — Z6834 Body mass index (BMI) 34.0-34.9, adult: Secondary | ICD-10-CM | POA: Diagnosis not present

## 2015-11-30 ENCOUNTER — Other Ambulatory Visit: Payer: Self-pay | Admitting: Obstetrics and Gynecology

## 2015-11-30 DIAGNOSIS — R928 Other abnormal and inconclusive findings on diagnostic imaging of breast: Secondary | ICD-10-CM

## 2015-12-05 ENCOUNTER — Ambulatory Visit
Admission: RE | Admit: 2015-12-05 | Discharge: 2015-12-05 | Disposition: A | Discharge status: Home / Self Care | Payer: BLUE CROSS/BLUE SHIELD | Source: Ambulatory Visit | Attending: Obstetrics and Gynecology | Admitting: Obstetrics and Gynecology

## 2015-12-05 ENCOUNTER — Other Ambulatory Visit: Payer: Self-pay | Admitting: Obstetrics and Gynecology

## 2015-12-05 DIAGNOSIS — R928 Other abnormal and inconclusive findings on diagnostic imaging of breast: Secondary | ICD-10-CM

## 2015-12-05 DIAGNOSIS — R921 Mammographic calcification found on diagnostic imaging of breast: Secondary | ICD-10-CM | POA: Diagnosis not present

## 2015-12-27 ENCOUNTER — Encounter: Payer: Self-pay | Admitting: Pediatrics

## 2016-01-31 DIAGNOSIS — F419 Anxiety disorder, unspecified: Secondary | ICD-10-CM | POA: Diagnosis not present

## 2016-04-30 ENCOUNTER — Other Ambulatory Visit: Payer: Self-pay | Admitting: Obstetrics and Gynecology

## 2016-04-30 DIAGNOSIS — R921 Mammographic calcification found on diagnostic imaging of breast: Secondary | ICD-10-CM

## 2016-05-07 DIAGNOSIS — F419 Anxiety disorder, unspecified: Secondary | ICD-10-CM | POA: Diagnosis not present

## 2016-05-07 DIAGNOSIS — Z23 Encounter for immunization: Secondary | ICD-10-CM | POA: Diagnosis not present

## 2016-05-07 DIAGNOSIS — K219 Gastro-esophageal reflux disease without esophagitis: Secondary | ICD-10-CM | POA: Diagnosis not present

## 2016-05-07 DIAGNOSIS — R002 Palpitations: Secondary | ICD-10-CM | POA: Diagnosis not present

## 2016-06-06 ENCOUNTER — Ambulatory Visit
Admission: RE | Admit: 2016-06-06 | Discharge: 2016-06-06 | Disposition: A | Discharge status: Home / Self Care | Payer: BLUE CROSS/BLUE SHIELD | Source: Ambulatory Visit | Attending: Obstetrics and Gynecology | Admitting: Obstetrics and Gynecology

## 2016-06-06 DIAGNOSIS — R921 Mammographic calcification found on diagnostic imaging of breast: Secondary | ICD-10-CM | POA: Diagnosis not present

## 2016-07-25 DIAGNOSIS — J029 Acute pharyngitis, unspecified: Secondary | ICD-10-CM | POA: Diagnosis not present

## 2016-07-25 DIAGNOSIS — M542 Cervicalgia: Secondary | ICD-10-CM | POA: Diagnosis not present

## 2016-07-25 DIAGNOSIS — R590 Localized enlarged lymph nodes: Secondary | ICD-10-CM | POA: Diagnosis not present

## 2016-11-27 ENCOUNTER — Other Ambulatory Visit: Payer: Self-pay | Admitting: Obstetrics and Gynecology

## 2016-11-27 DIAGNOSIS — R921 Mammographic calcification found on diagnostic imaging of breast: Secondary | ICD-10-CM

## 2016-11-30 ENCOUNTER — Ambulatory Visit
Admission: RE | Admit: 2016-11-30 | Discharge: 2016-11-30 | Disposition: A | Discharge status: Home / Self Care | Payer: BLUE CROSS/BLUE SHIELD | Source: Ambulatory Visit | Attending: Obstetrics and Gynecology | Admitting: Obstetrics and Gynecology

## 2016-11-30 DIAGNOSIS — R921 Mammographic calcification found on diagnostic imaging of breast: Secondary | ICD-10-CM

## 2016-11-30 DIAGNOSIS — R928 Other abnormal and inconclusive findings on diagnostic imaging of breast: Secondary | ICD-10-CM | POA: Diagnosis not present

## 2016-12-03 DIAGNOSIS — K219 Gastro-esophageal reflux disease without esophagitis: Secondary | ICD-10-CM | POA: Diagnosis not present

## 2016-12-03 DIAGNOSIS — F419 Anxiety disorder, unspecified: Secondary | ICD-10-CM | POA: Diagnosis not present

## 2016-12-03 DIAGNOSIS — E669 Obesity, unspecified: Secondary | ICD-10-CM | POA: Diagnosis not present

## 2016-12-03 DIAGNOSIS — I10 Essential (primary) hypertension: Secondary | ICD-10-CM | POA: Diagnosis not present

## 2017-09-10 DIAGNOSIS — Z124 Encounter for screening for malignant neoplasm of cervix: Secondary | ICD-10-CM | POA: Diagnosis not present

## 2017-09-10 DIAGNOSIS — Z01419 Encounter for gynecological examination (general) (routine) without abnormal findings: Secondary | ICD-10-CM | POA: Diagnosis not present

## 2017-09-10 DIAGNOSIS — Z6835 Body mass index (BMI) 35.0-35.9, adult: Secondary | ICD-10-CM | POA: Diagnosis not present

## 2017-10-11 DIAGNOSIS — R102 Pelvic and perineal pain: Secondary | ICD-10-CM | POA: Diagnosis not present

## 2017-10-11 DIAGNOSIS — Z6835 Body mass index (BMI) 35.0-35.9, adult: Secondary | ICD-10-CM | POA: Diagnosis not present

## 2018-01-24 ENCOUNTER — Other Ambulatory Visit: Payer: Self-pay | Admitting: Obstetrics and Gynecology

## 2018-01-24 DIAGNOSIS — R921 Mammographic calcification found on diagnostic imaging of breast: Secondary | ICD-10-CM

## 2018-02-05 ENCOUNTER — Ambulatory Visit
Admission: RE | Admit: 2018-02-05 | Discharge: 2018-02-05 | Disposition: A | Discharge status: Home / Self Care | Payer: BLUE CROSS/BLUE SHIELD | Source: Ambulatory Visit | Attending: Obstetrics and Gynecology | Admitting: Obstetrics and Gynecology

## 2018-02-05 DIAGNOSIS — R921 Mammographic calcification found on diagnostic imaging of breast: Secondary | ICD-10-CM | POA: Diagnosis not present

## 2018-04-07 DIAGNOSIS — Z23 Encounter for immunization: Secondary | ICD-10-CM | POA: Diagnosis not present

## 2018-04-07 DIAGNOSIS — R002 Palpitations: Secondary | ICD-10-CM | POA: Diagnosis not present

## 2018-04-07 DIAGNOSIS — F419 Anxiety disorder, unspecified: Secondary | ICD-10-CM | POA: Diagnosis not present

## 2018-07-14 DIAGNOSIS — D225 Melanocytic nevi of trunk: Secondary | ICD-10-CM | POA: Diagnosis not present

## 2018-07-14 DIAGNOSIS — D485 Neoplasm of uncertain behavior of skin: Secondary | ICD-10-CM | POA: Diagnosis not present

## 2018-07-14 DIAGNOSIS — Z1283 Encounter for screening for malignant neoplasm of skin: Secondary | ICD-10-CM | POA: Diagnosis not present

## 2019-02-16 DIAGNOSIS — M25562 Pain in left knee: Secondary | ICD-10-CM | POA: Diagnosis not present

## 2019-02-18 DIAGNOSIS — M238X2 Other internal derangements of left knee: Secondary | ICD-10-CM | POA: Diagnosis not present

## 2019-02-18 DIAGNOSIS — M25562 Pain in left knee: Secondary | ICD-10-CM | POA: Diagnosis not present

## 2019-02-18 DIAGNOSIS — M1712 Unilateral primary osteoarthritis, left knee: Secondary | ICD-10-CM | POA: Diagnosis not present

## 2019-04-13 ENCOUNTER — Other Ambulatory Visit: Payer: Self-pay | Admitting: Obstetrics and Gynecology

## 2019-04-13 DIAGNOSIS — Z1231 Encounter for screening mammogram for malignant neoplasm of breast: Secondary | ICD-10-CM

## 2019-04-16 DIAGNOSIS — Z01419 Encounter for gynecological examination (general) (routine) without abnormal findings: Secondary | ICD-10-CM | POA: Diagnosis not present

## 2019-04-16 DIAGNOSIS — Z6836 Body mass index (BMI) 36.0-36.9, adult: Secondary | ICD-10-CM | POA: Diagnosis not present

## 2019-04-16 DIAGNOSIS — Z1231 Encounter for screening mammogram for malignant neoplasm of breast: Secondary | ICD-10-CM | POA: Diagnosis not present

## 2019-04-16 DIAGNOSIS — Z0000 Encounter for general adult medical examination without abnormal findings: Secondary | ICD-10-CM | POA: Diagnosis not present

## 2019-04-20 ENCOUNTER — Other Ambulatory Visit: Payer: Self-pay | Admitting: Obstetrics and Gynecology

## 2019-04-20 DIAGNOSIS — R928 Other abnormal and inconclusive findings on diagnostic imaging of breast: Secondary | ICD-10-CM

## 2019-04-21 ENCOUNTER — Ambulatory Visit: Payer: BLUE CROSS/BLUE SHIELD

## 2019-04-21 ENCOUNTER — Other Ambulatory Visit: Payer: Self-pay

## 2019-04-21 ENCOUNTER — Ambulatory Visit
Admission: RE | Admit: 2019-04-21 | Discharge: 2019-04-21 | Disposition: A | Discharge status: Home / Self Care | Payer: BC Managed Care – PPO | Source: Ambulatory Visit | Attending: Obstetrics and Gynecology | Admitting: Obstetrics and Gynecology

## 2019-04-21 DIAGNOSIS — R928 Other abnormal and inconclusive findings on diagnostic imaging of breast: Secondary | ICD-10-CM

## 2019-04-21 DIAGNOSIS — R922 Inconclusive mammogram: Secondary | ICD-10-CM | POA: Diagnosis not present

## 2019-05-11 DIAGNOSIS — I10 Essential (primary) hypertension: Secondary | ICD-10-CM | POA: Diagnosis not present

## 2019-09-22 ENCOUNTER — Emergency Department (HOSPITAL_COMMUNITY): Payer: BC Managed Care – PPO

## 2019-09-22 ENCOUNTER — Other Ambulatory Visit: Payer: Self-pay

## 2019-09-22 ENCOUNTER — Emergency Department (HOSPITAL_COMMUNITY)
Admission: EM | Admit: 2019-09-22 | Discharge: 2019-09-23 | Disposition: A | Discharge status: Home / Self Care | Payer: BC Managed Care – PPO | Attending: Emergency Medicine | Admitting: Emergency Medicine

## 2019-09-22 ENCOUNTER — Encounter (HOSPITAL_COMMUNITY): Payer: Self-pay | Admitting: Emergency Medicine

## 2019-09-22 DIAGNOSIS — R0789 Other chest pain: Secondary | ICD-10-CM | POA: Diagnosis not present

## 2019-09-22 DIAGNOSIS — R9431 Abnormal electrocardiogram [ECG] [EKG]: Secondary | ICD-10-CM | POA: Diagnosis not present

## 2019-09-22 DIAGNOSIS — R002 Palpitations: Secondary | ICD-10-CM | POA: Diagnosis not present

## 2019-09-22 DIAGNOSIS — R079 Chest pain, unspecified: Secondary | ICD-10-CM | POA: Diagnosis not present

## 2019-09-22 DIAGNOSIS — Z79899 Other long term (current) drug therapy: Secondary | ICD-10-CM | POA: Insufficient documentation

## 2019-09-22 LAB — CBC
HCT: 40.1 % (ref 36.0–46.0)
Hemoglobin: 13.1 g/dL (ref 12.0–15.0)
MCH: 30.3 pg (ref 26.0–34.0)
MCHC: 32.7 g/dL (ref 30.0–36.0)
MCV: 92.8 fL (ref 80.0–100.0)
Platelets: 307 10*3/uL (ref 150–400)
RBC: 4.32 MIL/uL (ref 3.87–5.11)
RDW: 12.8 % (ref 11.5–15.5)
WBC: 6.8 10*3/uL (ref 4.0–10.5)
nRBC: 0 % (ref 0.0–0.2)

## 2019-09-22 LAB — BASIC METABOLIC PANEL
Anion gap: 10 (ref 5–15)
BUN: <5 mg/dL — ABNORMAL LOW (ref 6–20)
CO2: 21 mmol/L — ABNORMAL LOW (ref 22–32)
Calcium: 9.3 mg/dL (ref 8.9–10.3)
Chloride: 107 mmol/L (ref 98–111)
Creatinine, Ser: 0.79 mg/dL (ref 0.44–1.00)
GFR calc Af Amer: >60 mL/min (ref >60)
GFR calc non Af Amer: >60 mL/min (ref >60)
Glucose, Bld: 117 mg/dL — ABNORMAL HIGH (ref 70–99)
Potassium: 3.9 mmol/L (ref 3.5–5.1)
Sodium: 138 mmol/L (ref 135–145)

## 2019-09-22 LAB — TROPONIN I (HIGH SENSITIVITY): Troponin I (High Sensitivity): <2 ng/L (ref <18)

## 2019-09-22 LAB — I-STAT BETA HCG BLOOD, ED (MC, WL, AP ONLY): I-stat hCG, quantitative: <5 m[IU]/mL (ref <5)

## 2019-09-22 NOTE — ED Triage Notes (Signed)
Pt endorses CP since Saturday. Sent by PCP due to ST depression on EKG. States it feels like her heart skips a beat intermittently. Denies SOB or dizziness. Takes metoprolol for tachycardia.

## 2019-09-23 LAB — TROPONIN I (HIGH SENSITIVITY): Troponin I (High Sensitivity): <2 ng/L (ref <18)

## 2019-09-23 NOTE — Discharge Instructions (Signed)
Your heart markers are normal.  We see no concerning changes in your EKG when compared to priors.  We recommend that you follow-up with your doctor.  Please return for any new or worsening symptoms.

## 2019-09-23 NOTE — ED Provider Notes (Signed)
[facility] EMERGENCY DEPARTMENT Provider Note   CSN: [id number] Arrival date & time: 09/22/19  1757     History Chief Complaint  Patient presents with  . Chest Pain    Tammy Roth is a 49 y.o. female.  Patient presents to the emergency department with a chief complaint of palpitations.  She states that she had had some discomfort chest pain that have been ongoing for the past several days.  Reports having worsening palpitation symptoms.  States that she was seen by her PCP this morning and had an EKG which showed some ST depression.  She was sent to the emergency department for further evaluation.  She denies having any shortness of breath, nausea, diaphoresis.  She states that she has a history of rapid heart rate, and takes metoprolol for this.  She denies any other medical problems.  She does not smoke.  States that her brother had a heart attack when he was in his late 16s.  She denies having had any pain while in the emergency department.  Denies any symptoms currently.  No treatments prior to arrival.  The history is provided by the patient. No language interpreter was used.       Past Medical History:  Diagnosis Date  . Anemia   . Tachycardia     There are no problems to display for this patient.   History reviewed. No pertinent surgical history.   OB History   No obstetric history on file.     Family History  Problem Relation Age of Onset  . Breast cancer Maternal Aunt     Social History   Tobacco Use  . Smoking status: Never Smoker  Substance Use Topics  . Alcohol use: No  . Drug use: Not on file    Home Medications Prior to Admission medications   Medication Sig Start Date End Date Taking? Authorizing Provider  FLUoxetine (PROZAC) 20 MG tablet Take 20 mg by mouth daily.  01/03/14   [provider]  IRON PO Take 1 tablet by mouth daily.    [provider]    Allergies    Patient has no known allergies.   Review of Systems   Review of Systems  All other systems reviewed and are negative.   Physical Exam Updated Vital Signs BP 136/77 (BP Location: Right Arm)   Pulse 83   Temp 99 F (37.2 C) (Oral)   Resp 16   Ht 5\' 5"  (1.651 m)   Wt 107.5 kg   LMP 09/06/2019   SpO2 97%   BMI 39.44 kg/m   Physical Exam Vitals and nursing note reviewed.  Constitutional:      General: She is not in acute distress.    Appearance: She is well-developed.  HENT:     Head: Normocephalic and atraumatic.  Eyes:     Conjunctiva/sclera: Conjunctivae normal.  Cardiovascular:     Rate and Rhythm: Normal rate and regular rhythm.     Heart sounds: No murmur.  Pulmonary:     Effort: Pulmonary effort is normal. No respiratory distress.     Breath sounds: Normal breath sounds.  Abdominal:     Palpations: Abdomen is soft.     Tenderness: There is no abdominal tenderness.  Musculoskeletal:        General: Normal range of motion.     Cervical back: Neck supple.  Skin:    General: Skin is warm and dry.  Neurological:     Mental Status:  She is alert and oriented to person, place, and time.  Psychiatric:        Mood and Affect: Mood normal.        Behavior: Behavior normal.     ED Results / Procedures / Treatments   Labs (all labs ordered are listed, but only abnormal results are displayed) Labs Reviewed  BASIC METABOLIC PANEL - Abnormal; Notable for the following components:      Result Value   CO2 21 (*)    Glucose, Bld 117 (*)    BUN <5 (*)    All other components within normal limits  CBC  I-STAT BETA HCG BLOOD, ED ([facility], [facility], [facility] ONLY)  TROPONIN I (HIGH SENSITIVITY)  TROPONIN I (HIGH SENSITIVITY)    EKG EKG Interpretation  Date/Time:  Wednesday September 23 2019 01:50:28 EDT Ventricular Rate:  80 PR Interval:  160 QRS Duration: 102 QT Interval:  380 QTC Calculation: 439 R Axis:   19 Text Interpretation: Sinus rhythm Low voltage, precordial leads Confirmed by Zadie Rhine ([id number]) on  09/23/2019 1:53:50 AM   Radiology DG Chest 2 View  Result Date: 09/22/2019 CLINICAL DATA:  Chest pain EXAM: CHEST - 2 VIEW COMPARISON:  01/27/2014 FINDINGS: The heart size and mediastinal contours are within normal limits. Both lungs are clear. The visualized skeletal structures are unremarkable. IMPRESSION: Normal study. Electronically Signed   By: Charlett Nose M.D.   On: 09/22/2019 19:08    Procedures Procedures (including critical care time)  Medications Ordered in ED Medications - No data to display  ED Course  I have reviewed the triage vital signs and the nursing notes.  Pertinent labs & imaging results that were available during my care of the patient were reviewed by me and considered in my medical decision making (see chart for details).    MDM Rules/Calculators/A&P                      This patient complains of palpitations and EKG changes, this involves an extensive number of treatment options, and is a complaint that carries with it a high risk of complications and morbidity.  The differential diagnosis includes ACS, dysrhythmias, metabolic dysfunction.   I ordered, reviewed, and interpreted labs, which included CBC, BMP, troponins x 2, which are all reassuring.  No significant abnormality.  ACS thought to be less likely given normal trops and no dynamic EKG changes.  Electrolytes are normal.  No dysrhythmias on EKG.  Symptoms could be spasm, palpitations, inflammation, but I don't think the patient requires admission based on reassuring results tonight. I ordered imaging studies which included CXR and I independently visualized and interpreted imaging which showed no significant acute findings. Additional history obtained from patient. Previous records obtained and reviewed show previous workup for palpitations.   After the interventions stated above, I reevaluated the patient and found her stable for discharge with continued outpatient workup.  Final Clinical  Impression(s) / ED Diagnoses Final diagnoses:  Palpitations    Rx / DC Orders ED Discharge Orders    None       Roxy Horseman, PA-C 09/23/19 0416    Zadie Rhine, MD 09/23/19 [phone]

## 2019-09-23 NOTE — ED Notes (Signed)
Patient verbalizes understanding of discharge instructions. Opportunity for questioning and answers were provided. Armband removed by staff, pt discharged from ED stable & ambulatory with family 

## 2019-10-21 DIAGNOSIS — K219 Gastro-esophageal reflux disease without esophagitis: Secondary | ICD-10-CM | POA: Diagnosis not present

## 2020-03-11 ENCOUNTER — Other Ambulatory Visit: Payer: Self-pay | Admitting: Gastroenterology

## 2020-03-11 DIAGNOSIS — R1010 Upper abdominal pain, unspecified: Secondary | ICD-10-CM | POA: Diagnosis not present

## 2020-03-11 DIAGNOSIS — Z01818 Encounter for other preprocedural examination: Secondary | ICD-10-CM | POA: Diagnosis not present

## 2020-03-11 DIAGNOSIS — M549 Dorsalgia, unspecified: Secondary | ICD-10-CM

## 2020-03-25 ENCOUNTER — Ambulatory Visit
Admission: RE | Admit: 2020-03-25 | Discharge: 2020-03-25 | Disposition: A | Discharge status: Home / Self Care | Payer: BC Managed Care – PPO | Source: Ambulatory Visit | Attending: Gastroenterology | Admitting: Gastroenterology

## 2020-03-25 DIAGNOSIS — R1010 Upper abdominal pain, unspecified: Secondary | ICD-10-CM

## 2020-03-25 DIAGNOSIS — K8020 Calculus of gallbladder without cholecystitis without obstruction: Secondary | ICD-10-CM | POA: Diagnosis not present

## 2020-03-25 DIAGNOSIS — I700 Atherosclerosis of aorta: Secondary | ICD-10-CM | POA: Diagnosis not present

## 2020-03-25 DIAGNOSIS — K760 Fatty (change of) liver, not elsewhere classified: Secondary | ICD-10-CM | POA: Diagnosis not present

## 2020-03-25 DIAGNOSIS — M549 Dorsalgia, unspecified: Secondary | ICD-10-CM

## 2020-03-25 DIAGNOSIS — K429 Umbilical hernia without obstruction or gangrene: Secondary | ICD-10-CM | POA: Diagnosis not present

## 2020-03-25 MED ORDER — IOPAMIDOL (ISOVUE-300) INJECTION 61%
100.0000 mL | Freq: Once | INTRAVENOUS | Status: AC | PRN
  Administered 2020-03-25: 100 mL via INTRAVENOUS

## 2020-04-18 DIAGNOSIS — Z1159 Encounter for screening for other viral diseases: Secondary | ICD-10-CM | POA: Diagnosis not present

## 2020-04-19 DIAGNOSIS — Z6827 Body mass index (BMI) 27.0-27.9, adult: Secondary | ICD-10-CM | POA: Diagnosis not present

## 2020-04-19 DIAGNOSIS — Z1231 Encounter for screening mammogram for malignant neoplasm of breast: Secondary | ICD-10-CM | POA: Diagnosis not present

## 2020-04-19 DIAGNOSIS — Z01419 Encounter for gynecological examination (general) (routine) without abnormal findings: Secondary | ICD-10-CM | POA: Diagnosis not present

## 2020-04-21 DIAGNOSIS — D120 Benign neoplasm of cecum: Secondary | ICD-10-CM | POA: Diagnosis not present

## 2020-04-21 DIAGNOSIS — D125 Benign neoplasm of sigmoid colon: Secondary | ICD-10-CM | POA: Diagnosis not present

## 2020-04-21 DIAGNOSIS — D123 Benign neoplasm of transverse colon: Secondary | ICD-10-CM | POA: Diagnosis not present

## 2020-04-21 DIAGNOSIS — K3189 Other diseases of stomach and duodenum: Secondary | ICD-10-CM | POA: Diagnosis not present

## 2020-04-21 DIAGNOSIS — K219 Gastro-esophageal reflux disease without esophagitis: Secondary | ICD-10-CM | POA: Diagnosis not present

## 2020-04-21 DIAGNOSIS — K2980 Duodenitis without bleeding: Secondary | ICD-10-CM | POA: Diagnosis not present

## 2020-04-21 DIAGNOSIS — Z1211 Encounter for screening for malignant neoplasm of colon: Secondary | ICD-10-CM | POA: Diagnosis not present

## 2020-04-21 DIAGNOSIS — D122 Benign neoplasm of ascending colon: Secondary | ICD-10-CM | POA: Diagnosis not present

## 2020-04-21 DIAGNOSIS — K209 Esophagitis, unspecified without bleeding: Secondary | ICD-10-CM | POA: Diagnosis not present

## 2020-04-21 DIAGNOSIS — K2930 Chronic superficial gastritis without bleeding: Secondary | ICD-10-CM | POA: Diagnosis not present

## 2020-04-21 DIAGNOSIS — K648 Other hemorrhoids: Secondary | ICD-10-CM | POA: Diagnosis not present

## 2020-04-21 DIAGNOSIS — R1010 Upper abdominal pain, unspecified: Secondary | ICD-10-CM | POA: Diagnosis not present

## 2020-04-21 DIAGNOSIS — K319 Disease of stomach and duodenum, unspecified: Secondary | ICD-10-CM | POA: Diagnosis not present

## 2020-05-11 DIAGNOSIS — K8010 Calculus of gallbladder with chronic cholecystitis without obstruction: Secondary | ICD-10-CM | POA: Diagnosis not present

## 2020-05-20 DIAGNOSIS — Z01818 Encounter for other preprocedural examination: Secondary | ICD-10-CM | POA: Diagnosis not present

## 2020-05-24 ENCOUNTER — Other Ambulatory Visit: Payer: Self-pay | Admitting: Surgery

## 2020-05-24 DIAGNOSIS — K8010 Calculus of gallbladder with chronic cholecystitis without obstruction: Secondary | ICD-10-CM | POA: Diagnosis not present

## 2020-05-24 DIAGNOSIS — K8020 Calculus of gallbladder without cholecystitis without obstruction: Secondary | ICD-10-CM | POA: Diagnosis not present

## 2021-01-26 DIAGNOSIS — R519 Headache, unspecified: Secondary | ICD-10-CM | POA: Diagnosis not present

## 2021-01-26 DIAGNOSIS — R509 Fever, unspecified: Secondary | ICD-10-CM | POA: Diagnosis not present

## 2021-01-26 DIAGNOSIS — R059 Cough, unspecified: Secondary | ICD-10-CM | POA: Diagnosis not present

## 2021-01-26 DIAGNOSIS — U071 COVID-19: Secondary | ICD-10-CM | POA: Diagnosis not present

## 2021-04-26 ENCOUNTER — Other Ambulatory Visit: Payer: Self-pay | Admitting: Obstetrics and Gynecology

## 2021-04-26 DIAGNOSIS — Z1231 Encounter for screening mammogram for malignant neoplasm of breast: Secondary | ICD-10-CM

## 2021-05-31 ENCOUNTER — Ambulatory Visit
Admission: RE | Admit: 2021-05-31 | Discharge: 2021-05-31 | Disposition: A | Discharge status: Home / Self Care | Payer: BC Managed Care – PPO | Source: Ambulatory Visit | Attending: Obstetrics and Gynecology | Admitting: Obstetrics and Gynecology

## 2021-05-31 DIAGNOSIS — Z1231 Encounter for screening mammogram for malignant neoplasm of breast: Secondary | ICD-10-CM | POA: Diagnosis not present

## 2021-06-02 ENCOUNTER — Other Ambulatory Visit: Payer: Self-pay | Admitting: Obstetrics and Gynecology

## 2021-06-02 DIAGNOSIS — R928 Other abnormal and inconclusive findings on diagnostic imaging of breast: Secondary | ICD-10-CM

## 2021-07-11 ENCOUNTER — Ambulatory Visit
Admission: RE | Admit: 2021-07-11 | Discharge: 2021-07-11 | Disposition: A | Discharge status: Home / Self Care | Payer: BC Managed Care – PPO | Source: Ambulatory Visit | Attending: Obstetrics and Gynecology | Admitting: Obstetrics and Gynecology

## 2021-07-11 ENCOUNTER — Other Ambulatory Visit: Payer: Self-pay | Admitting: Obstetrics and Gynecology

## 2021-07-11 DIAGNOSIS — R928 Other abnormal and inconclusive findings on diagnostic imaging of breast: Secondary | ICD-10-CM

## 2021-07-11 DIAGNOSIS — N6310 Unspecified lump in the right breast, unspecified quadrant: Secondary | ICD-10-CM

## 2021-07-11 DIAGNOSIS — R922 Inconclusive mammogram: Secondary | ICD-10-CM | POA: Diagnosis not present

## 2021-07-18 ENCOUNTER — Ambulatory Visit
Admission: RE | Admit: 2021-07-18 | Discharge: 2021-07-18 | Disposition: A | Discharge status: Home / Self Care | Payer: BC Managed Care – PPO | Source: Ambulatory Visit | Attending: Obstetrics and Gynecology | Admitting: Obstetrics and Gynecology

## 2021-07-18 ENCOUNTER — Other Ambulatory Visit: Payer: Self-pay

## 2021-07-18 DIAGNOSIS — N6310 Unspecified lump in the right breast, unspecified quadrant: Secondary | ICD-10-CM

## 2021-07-18 DIAGNOSIS — N6312 Unspecified lump in the right breast, upper inner quadrant: Secondary | ICD-10-CM | POA: Diagnosis not present

## 2021-07-18 DIAGNOSIS — D241 Benign neoplasm of right breast: Secondary | ICD-10-CM | POA: Diagnosis not present

## 2021-07-18 HISTORY — PX: BREAST BIOPSY: SHX20

## 2021-07-25 ENCOUNTER — Other Ambulatory Visit: Payer: BC Managed Care – PPO

## 2022-03-20 IMAGING — MG MM BREAST LOCALIZATION CLIP
4 series · 4 of 12 positions shown · non-contrast
Comparison: Previous exam(s).

CLINICAL DATA: Evaluate biopsy marker

EXAM:
3D DIAGNOSTIC RIGHT MAMMOGRAM POST ULTRASOUND BIOPSY

[R ML synth-2D]
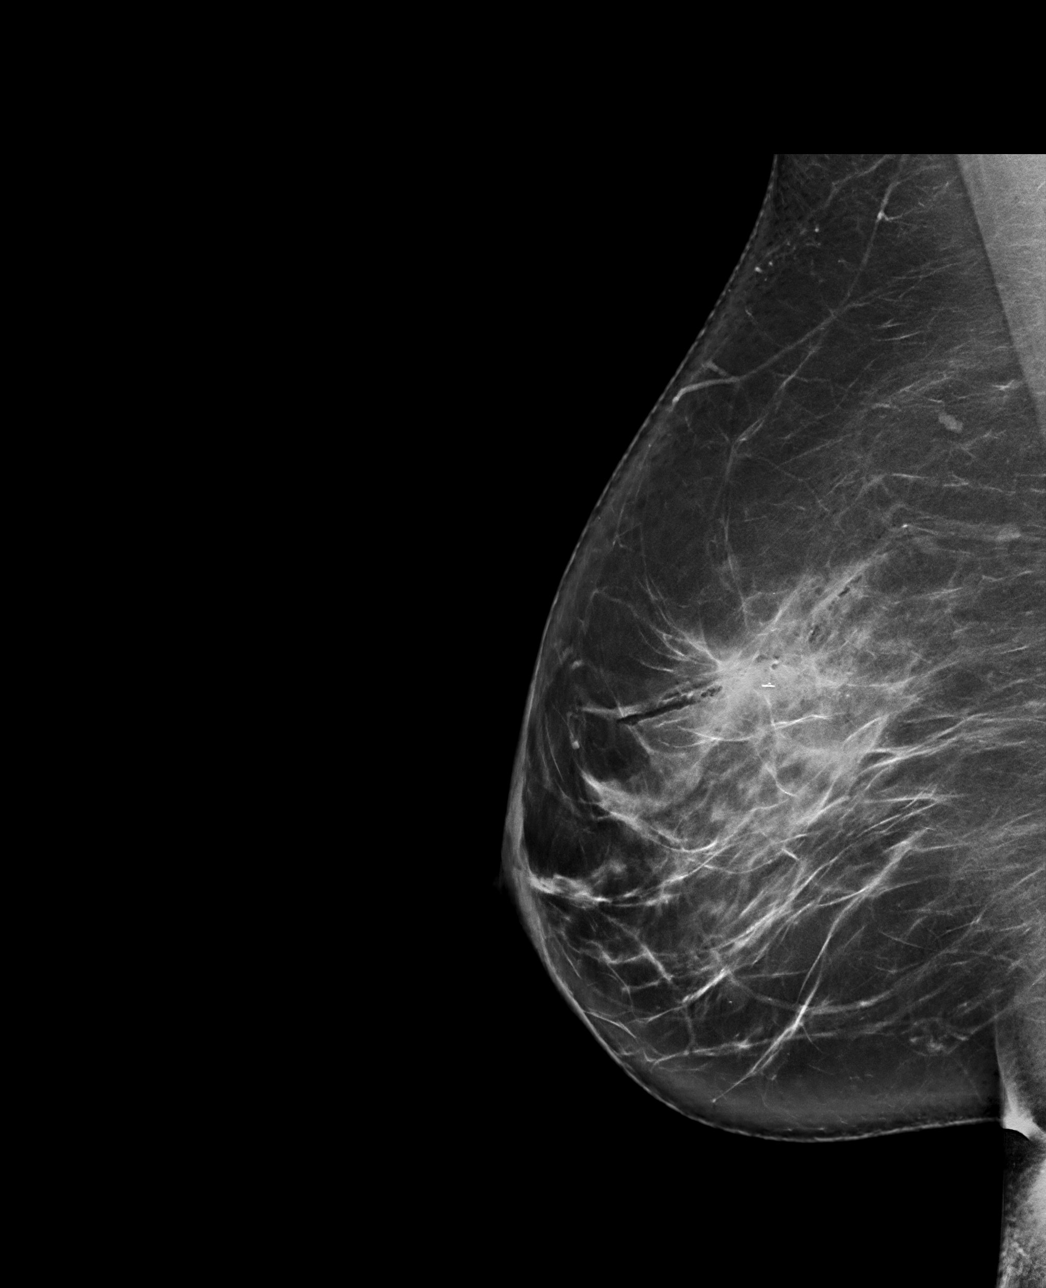

[R CC synth-2D]
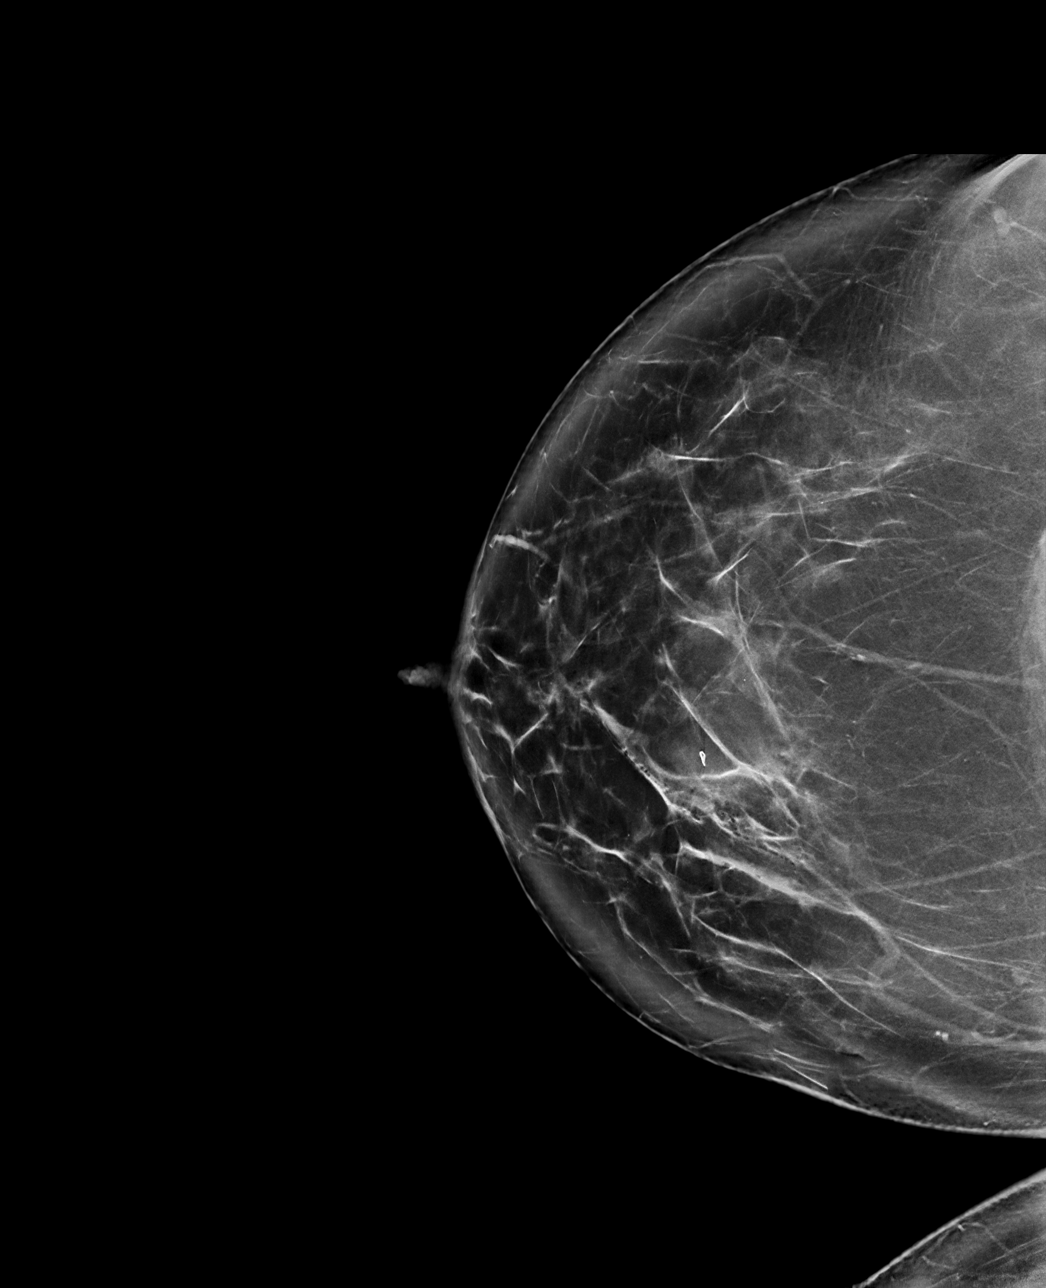

[R ML tomo · tomo slice 47/92.0]
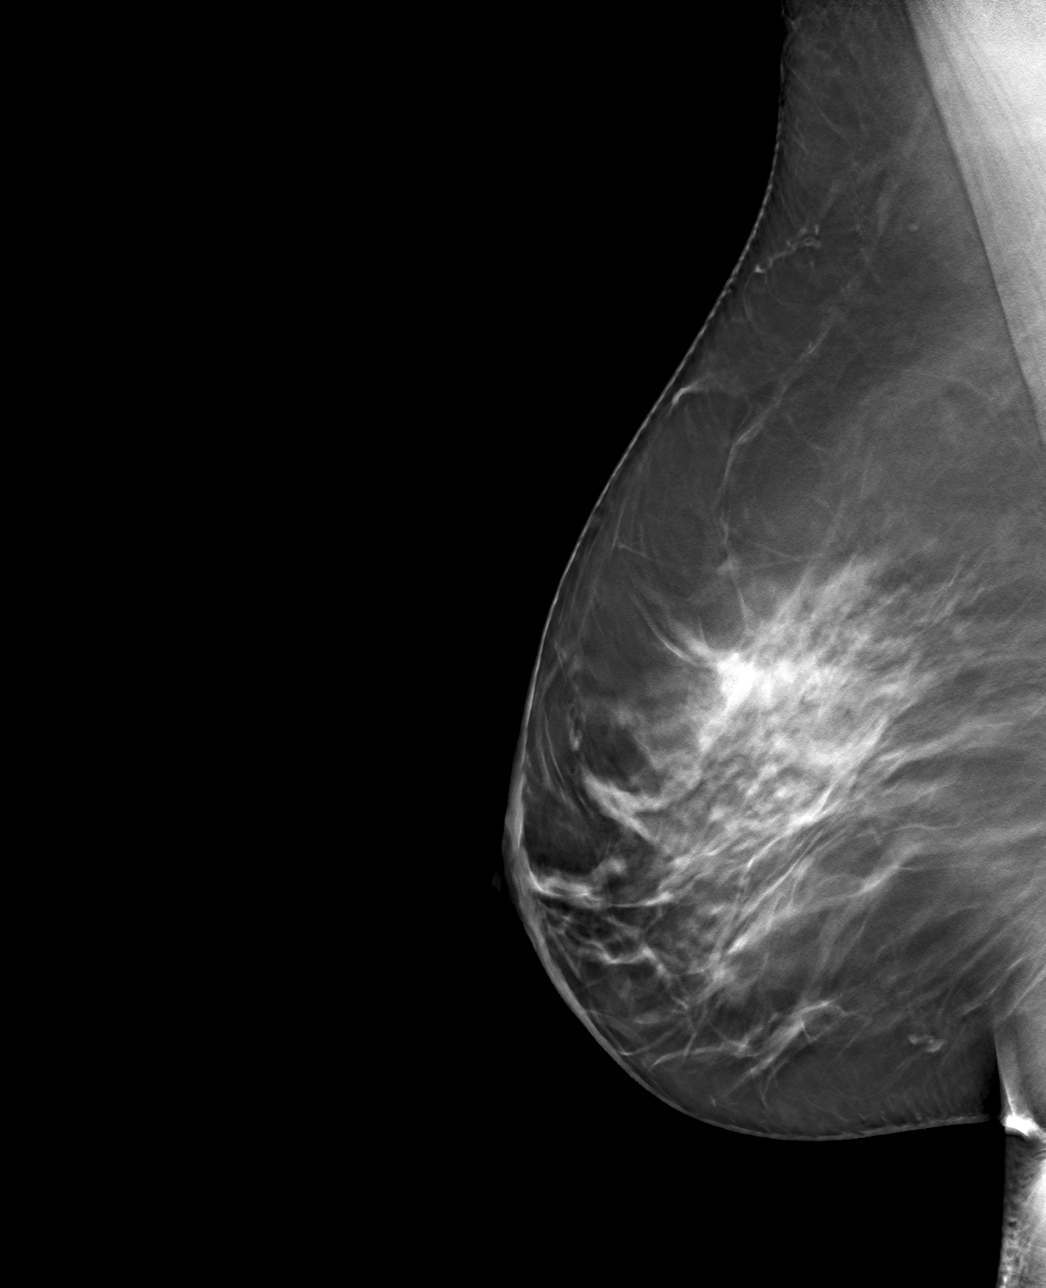

[R CC tomo · tomo slice 47/93.0]
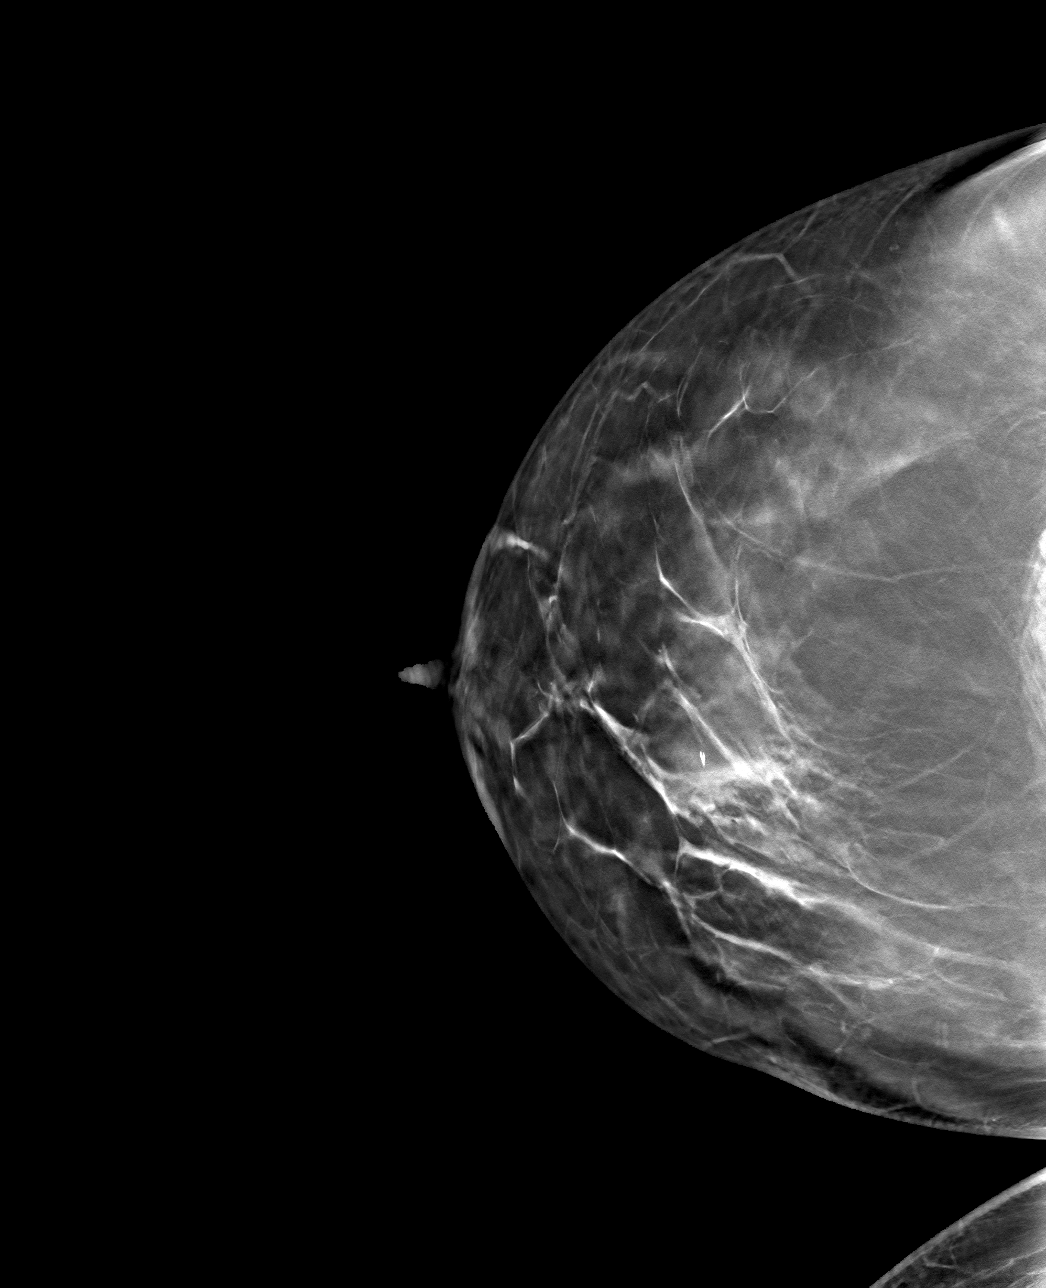

[4 of 12 positions shown; findings below may reference images not displayed]

FINDINGS: 3D Mammographic images were obtained following ultrasound guided
biopsy of a right breast mass. The biopsy marking clip is in
expected position at the site of biopsy.
IMPRESSION: Appropriate positioning of the ribbon shaped biopsy marking clip at
the site of biopsy in the location of the biopsied right breast
mass.

Final Assessment: Post Procedure Mammograms for Marker Placement

## 2022-03-20 IMAGING — US US  BREAST BX W/ LOC DEV 1ST LESION IMG BX SPEC US GUIDE*R*
1 series · 13 of 13 positions shown · non-contrast
Comparison: Previous exam(s).
COMPARISON: Previous exam(s).

Addendum:
CLINICAL DATA: Biopsy of a 1 o'clock right breast mass

EXAM:
ULTRASOUND GUIDED RIGHT BREAST CORE NEEDLE BIOPSY

[Series 1: us breast bx w/ loc dev 1st lesion img bx spec us  · 0.07mm/px · 13 of 13 slices shown]
[im 1/13]
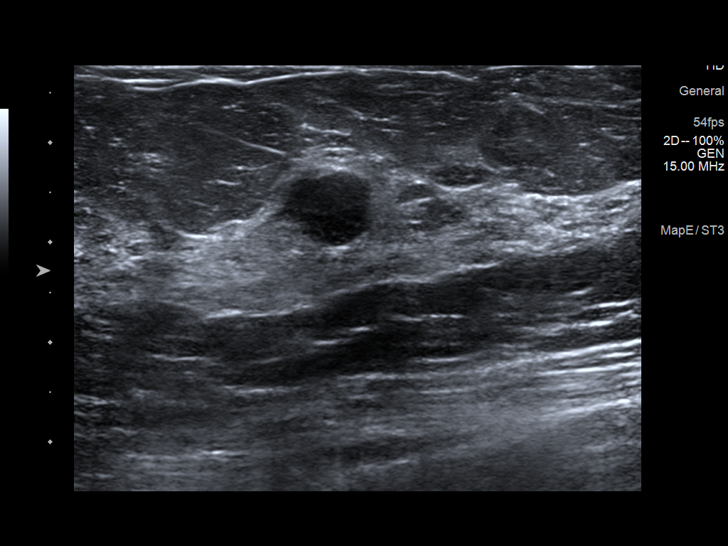
[im 2/13]
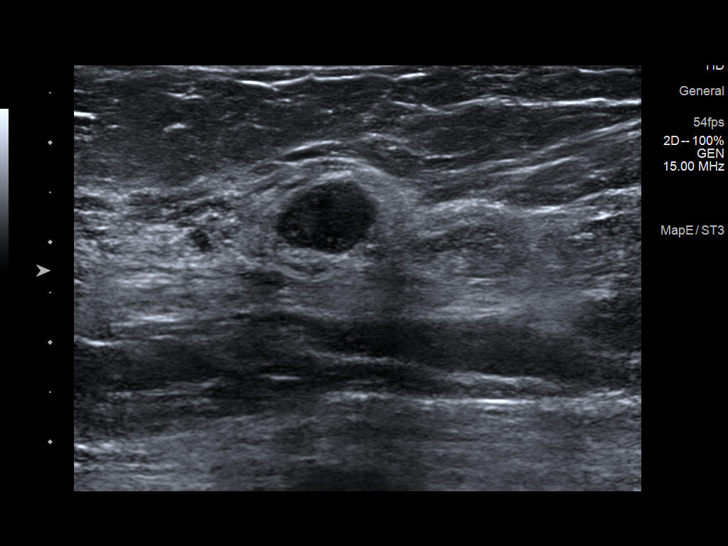
[im 3/13]
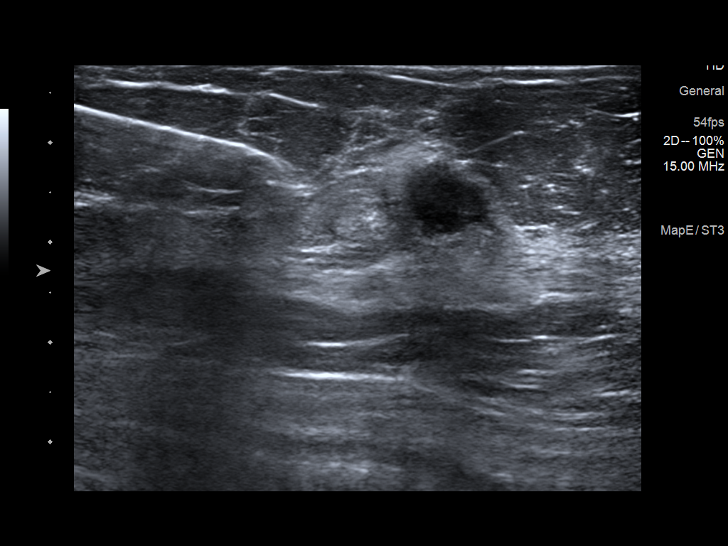
[im 4/13]
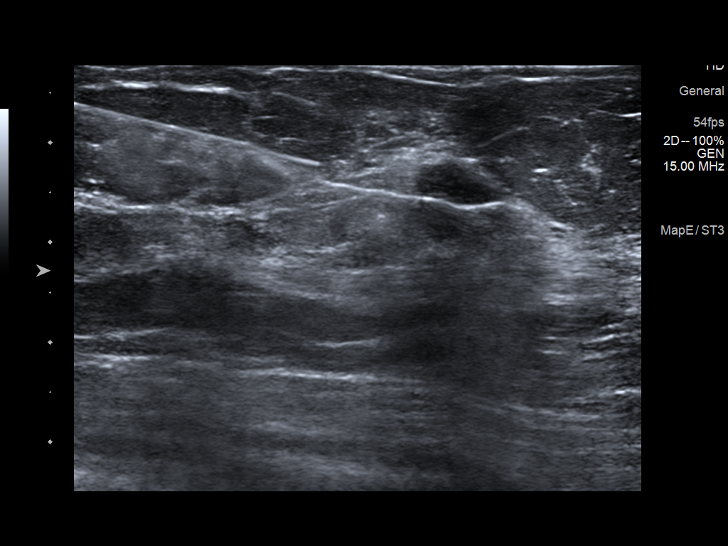
[im 5/13]
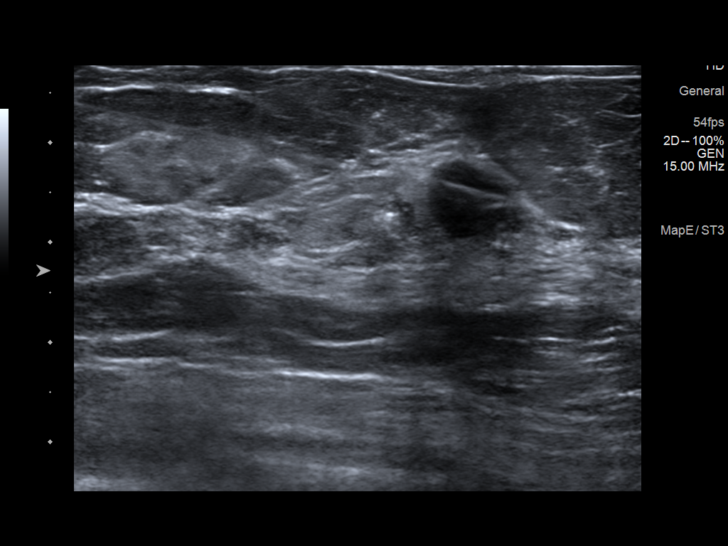
[im 6/13]
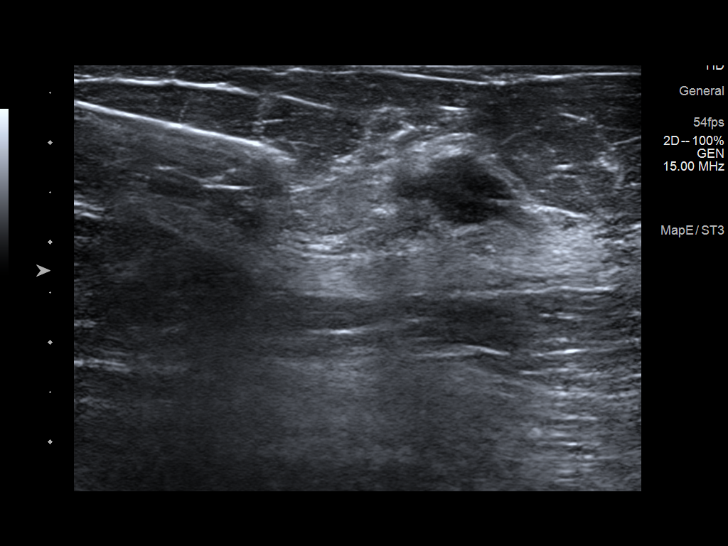
[im 7/13]
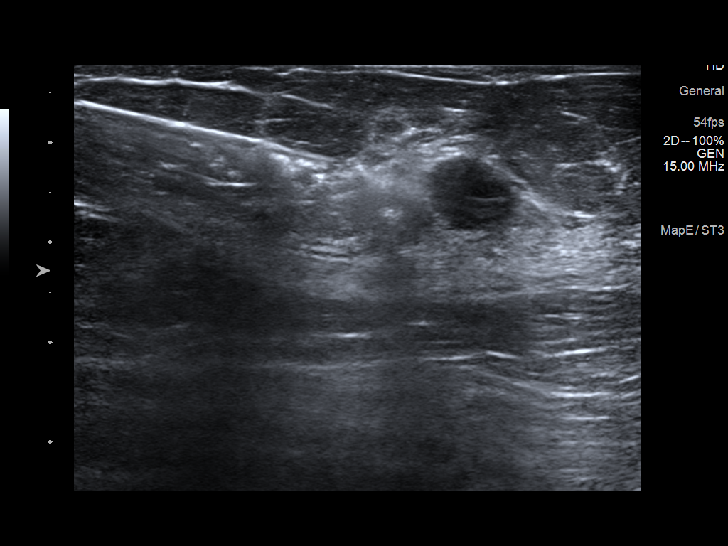
[im 8/13]
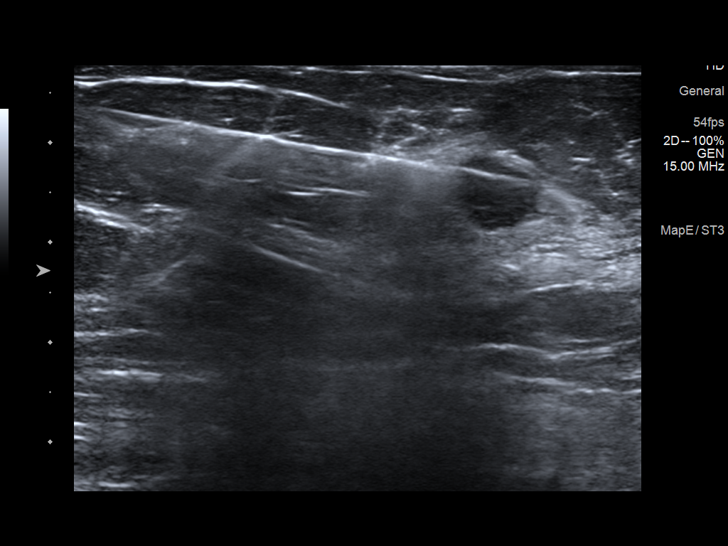
[im 9/13]
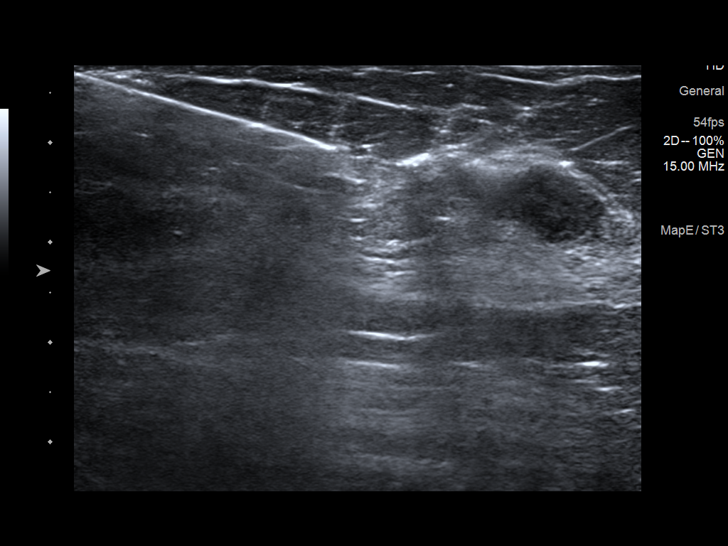
[im 10/13]
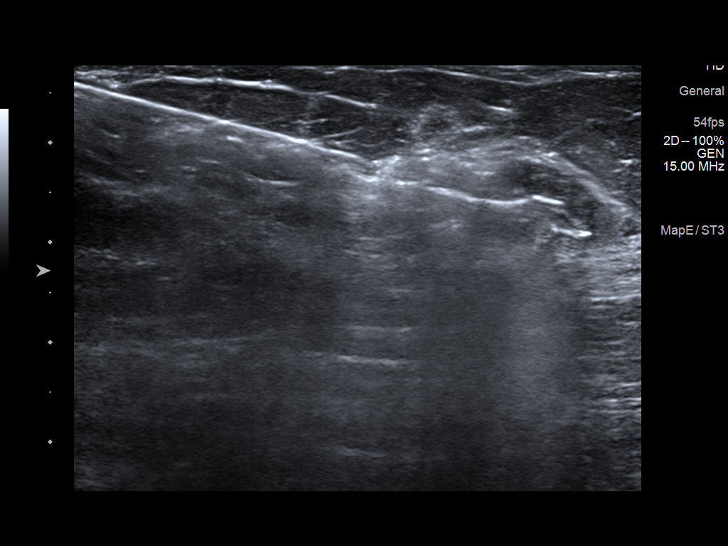
[im 11/13]
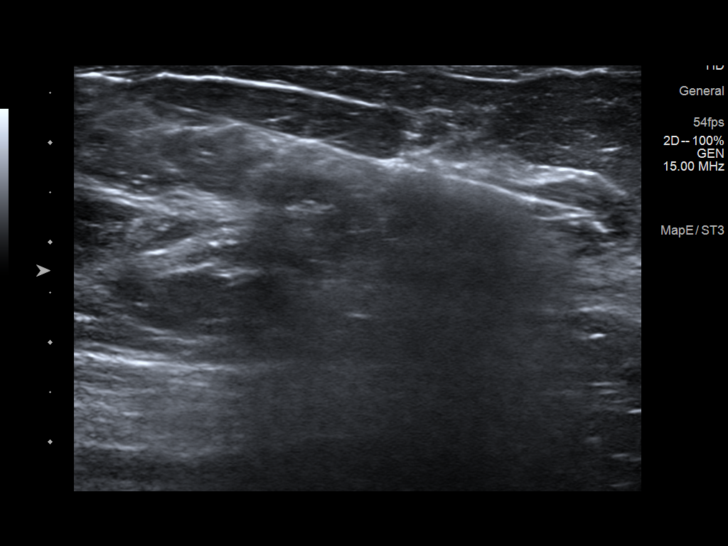
[im 12/13]
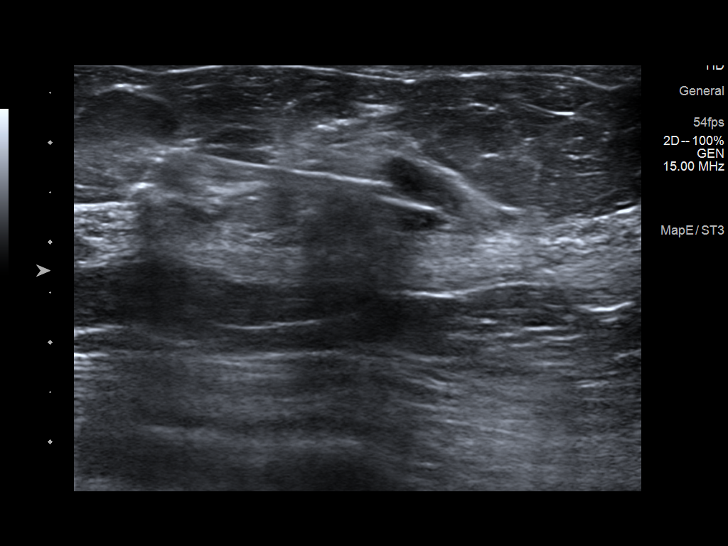
[im 13/13]
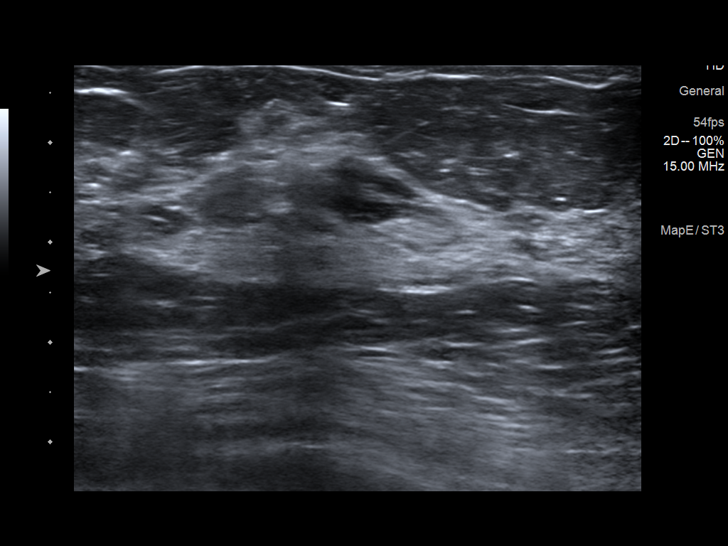

[13 of 13 positions shown; findings below may reference images not displayed]



Lesion quadrant: 1 o'clock right breast

Using sterile technique and 1% Lidocaine as local anesthetic, under
direct ultrasound visualization, a 12 gauge Auad device was
used to perform biopsy of a 1 o'clock right breast mass using a
medial approach. At the conclusion of the procedure a ribbon shaped
tissue marker clip was deployed into the biopsy cavity. Follow up 2
view mammogram was performed and dictated separately.
IMPRESSION: Ultrasound guided biopsy of a 1 o'clock right breast mass. No
apparent complications.

ADDENDUM:
Pathology revealed FIBROADENOMA- NO MALIGNANCY IDENTIFIED of the
RIGHT breast, 1 o'clock, 8cmfn (ribbon clip). This was found to be
concordant by Dr. Dannies Jaiman.

Pathology results were discussed with the patient by telephone. The
patient reported doing well after the biopsy with tenderness at the
site. Post biopsy instructions and care were reviewed and questions
were answered. The patient was encouraged to call The [REDACTED]

The patient was instructed to return for annual screening
mammography and informed a reminder notice would be sent regarding
this appointment.

Pathology results reported by Enza Maria Naro RN on 07/19/2021.



Lesion quadrant: 1 o'clock right breast

Using sterile technique and 1% Lidocaine as local anesthetic, under
direct ultrasound visualization, a 12 gauge Auad device was
used to perform biopsy of a 1 o'clock right breast mass using a
medial approach. At the conclusion of the procedure a ribbon shaped
tissue marker clip was deployed into the biopsy cavity. Follow up 2
view mammogram was performed and dictated separately.
IMPRESSION: Ultrasound guided biopsy of a 1 o'clock right breast mass. No
apparent complications.

## 2024-06-15 ENCOUNTER — Telehealth: Payer: Self-pay

## 2024-06-15 NOTE — Telephone Encounter (Signed)
 FYI  Copied from CRM [id number]. Topic: Appointments - Transfer of Care >> Jun 15, 2024 11:57 AM Antony RAMAN wrote: Pt is requesting to transfer FROM: lisa miller Pt is requesting to transfer TO: betty jordan Reason for requested transfer: pt request It is the responsibility of the team the patient would like to transfer to (Dr. jordan) to reach out to the patient if for any reason this transfer is not acceptable.

## 2024-06-15 NOTE — Telephone Encounter (Signed)
 Fine with me. BJ

## 2024-09-18 ENCOUNTER — Ambulatory Visit: Admitting: Family Medicine
